# Patient Record
Sex: Female | Born: 1995 | Race: White | Hispanic: No | Marital: Married | Smoking: Never smoker
Health system: Southern US, Community
[De-identification: ages and names within clinical notes are randomized; demographics above are authoritative.]

## PROBLEM LIST (undated history)

## (undated) DIAGNOSIS — F419 Anxiety disorder, unspecified: Secondary | ICD-10-CM

## (undated) DIAGNOSIS — G43909 Migraine, unspecified, not intractable, without status migrainosus: Secondary | ICD-10-CM

## (undated) DIAGNOSIS — T7840XA Allergy, unspecified, initial encounter: Secondary | ICD-10-CM

## (undated) DIAGNOSIS — F32A Depression, unspecified: Secondary | ICD-10-CM

## (undated) DIAGNOSIS — D6851 Activated protein C resistance: Secondary | ICD-10-CM

## (undated) HISTORY — DX: Anxiety disorder, unspecified: F41.9

## (undated) HISTORY — DX: Activated protein C resistance: D68.51

## (undated) HISTORY — DX: Migraine, unspecified, not intractable, without status migrainosus: G43.909

## (undated) HISTORY — DX: Allergy, unspecified, initial encounter: T78.40XA

## (undated) HISTORY — DX: Depression, unspecified: F32.A

---
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
## 2017-01-05 DIAGNOSIS — Z227 Latent tuberculosis: Secondary | ICD-10-CM | POA: Insufficient documentation

## 2017-01-05 DIAGNOSIS — D6851 Activated protein C resistance: Secondary | ICD-10-CM | POA: Insufficient documentation

## 2017-01-05 DIAGNOSIS — J45909 Unspecified asthma, uncomplicated: Secondary | ICD-10-CM | POA: Insufficient documentation

## 2019-05-24 DIAGNOSIS — F32A Depression, unspecified: Secondary | ICD-10-CM | POA: Insufficient documentation

## 2019-06-01 ENCOUNTER — Ambulatory Visit
Admission: RE | Admit: 2019-06-01 | Discharge: 2019-06-01 | Disposition: A | Discharge status: Home / Self Care | Payer: BC Managed Care – PPO | Source: Ambulatory Visit | Attending: Family Medicine | Admitting: Family Medicine

## 2019-06-01 ENCOUNTER — Ambulatory Visit
Admission: RE | Admit: 2019-06-01 | Discharge: 2019-06-01 | Disposition: A | Discharge status: Home / Self Care | Payer: BC Managed Care – PPO | Attending: Family Medicine | Admitting: Family Medicine

## 2019-06-01 ENCOUNTER — Other Ambulatory Visit: Payer: Self-pay

## 2019-06-01 ENCOUNTER — Other Ambulatory Visit: Payer: Self-pay | Admitting: Family Medicine

## 2019-06-01 DIAGNOSIS — Z111 Encounter for screening for respiratory tuberculosis: Secondary | ICD-10-CM | POA: Insufficient documentation

## 2019-10-01 DIAGNOSIS — F419 Anxiety disorder, unspecified: Secondary | ICD-10-CM | POA: Insufficient documentation

## 2019-10-06 ENCOUNTER — Ambulatory Visit: Payer: BC Managed Care – PPO

## 2019-10-12 ENCOUNTER — Ambulatory Visit: Payer: BC Managed Care – PPO

## 2019-10-14 ENCOUNTER — Ambulatory Visit: Payer: BC Managed Care – PPO | Attending: Internal Medicine

## 2019-10-14 DIAGNOSIS — Z23 Encounter for immunization: Secondary | ICD-10-CM

## 2019-10-14 NOTE — Progress Notes (Signed)
   Covid-19 Vaccination Clinic  Name:  Kaneshia Cater    MRN: [id number] DOB: 1995-10-17  10/14/2019  Ms. Rodriges was observed post Covid-19 immunization for 15 minutes without incidence. She was provided with Vaccine Information Sheet and instruction to access the V-Safe system.   Ms. Halbleib was instructed to call 911 with any severe reactions post vaccine: Marland Kitchen Difficulty breathing  . Swelling of your face and throat  . A fast heartbeat  . A bad rash all over your body  . Dizziness and weakness    Immunizations Administered    Name Date Dose VIS Date Route   Pfizer COVID-19 Vaccine 10/14/2019 11:51 AM 0.3 mL 08/03/2019 Intramuscular   Manufacturer: [employer], [employer]   Lot: [id number]   NDC: [address]-1

## 2019-11-06 ENCOUNTER — Ambulatory Visit: Payer: BC Managed Care – PPO | Attending: Internal Medicine

## 2019-11-06 DIAGNOSIS — Z23 Encounter for immunization: Secondary | ICD-10-CM

## 2019-11-06 NOTE — Progress Notes (Signed)
   Covid-19 Vaccination Clinic  Name:  Valerie Bates    MRN: [id number] DOB: 28-Aug-1995  11/06/2019  Valerie Bates was observed post Covid-19 immunization for 15 minutes without incident. She was provided with Vaccine Information Sheet and instruction to access the V-Safe system.   Valerie Bates was instructed to call 911 with any severe reactions post vaccine: Marland Kitchen Difficulty breathing  . Swelling of face and throat  . A fast heartbeat  . A bad rash all over body  . Dizziness and weakness   Immunizations Administered    Name Date Dose VIS Date Route   Pfizer COVID-19 Vaccine 11/06/2019  9:07 AM 0.3 mL 08/03/2019 Intramuscular   Manufacturer: [employer], [employer]   Lot: [id number]   NDC: [address]-5

## 2020-05-14 ENCOUNTER — Other Ambulatory Visit: Payer: Self-pay | Admitting: Family Medicine

## 2020-05-14 ENCOUNTER — Ambulatory Visit
Admission: RE | Admit: 2020-05-14 | Discharge: 2020-05-14 | Disposition: A | Discharge status: Home / Self Care | Payer: BLUE CROSS/BLUE SHIELD | Attending: Family Medicine | Admitting: Family Medicine

## 2020-05-14 ENCOUNTER — Other Ambulatory Visit: Payer: Self-pay

## 2020-05-14 ENCOUNTER — Ambulatory Visit
Admission: RE | Admit: 2020-05-14 | Discharge: 2020-05-14 | Disposition: A | Discharge status: Home / Self Care | Payer: BLUE CROSS/BLUE SHIELD | Source: Ambulatory Visit | Attending: Family Medicine | Admitting: Family Medicine

## 2020-05-14 DIAGNOSIS — Z111 Encounter for screening for respiratory tuberculosis: Secondary | ICD-10-CM

## 2020-05-26 ENCOUNTER — Ambulatory Visit: Payer: BLUE CROSS/BLUE SHIELD | Attending: Internal Medicine

## 2020-05-26 DIAGNOSIS — Z23 Encounter for immunization: Secondary | ICD-10-CM

## 2020-05-26 NOTE — Progress Notes (Signed)
   Covid-19 Vaccination Clinic  Name:  Valerie Bates    MRN: [id number] DOB: [birth year]  05/26/2020  Valerie Bates was observed post Covid-19 immunization for 15 minutes without incident. She was provided with Vaccine Information Sheet and instruction to access the V-Safe system.   Valerie Bates was instructed to call 911 with any severe reactions post vaccine: Marland Kitchen Difficulty breathing  . Swelling of face and throat  . A fast heartbeat  . A bad rash all over body  . Dizziness and weakness

## 2021-11-25 IMAGING — CR DG CHEST 2V
2 series · 2 of 2 positions shown · non-contrast
Comparison: 06/01/2019

CLINICAL DATA: History of treated TB.  No current complaints.

EXAM:
CHEST - 2 VIEW

[chest pa]
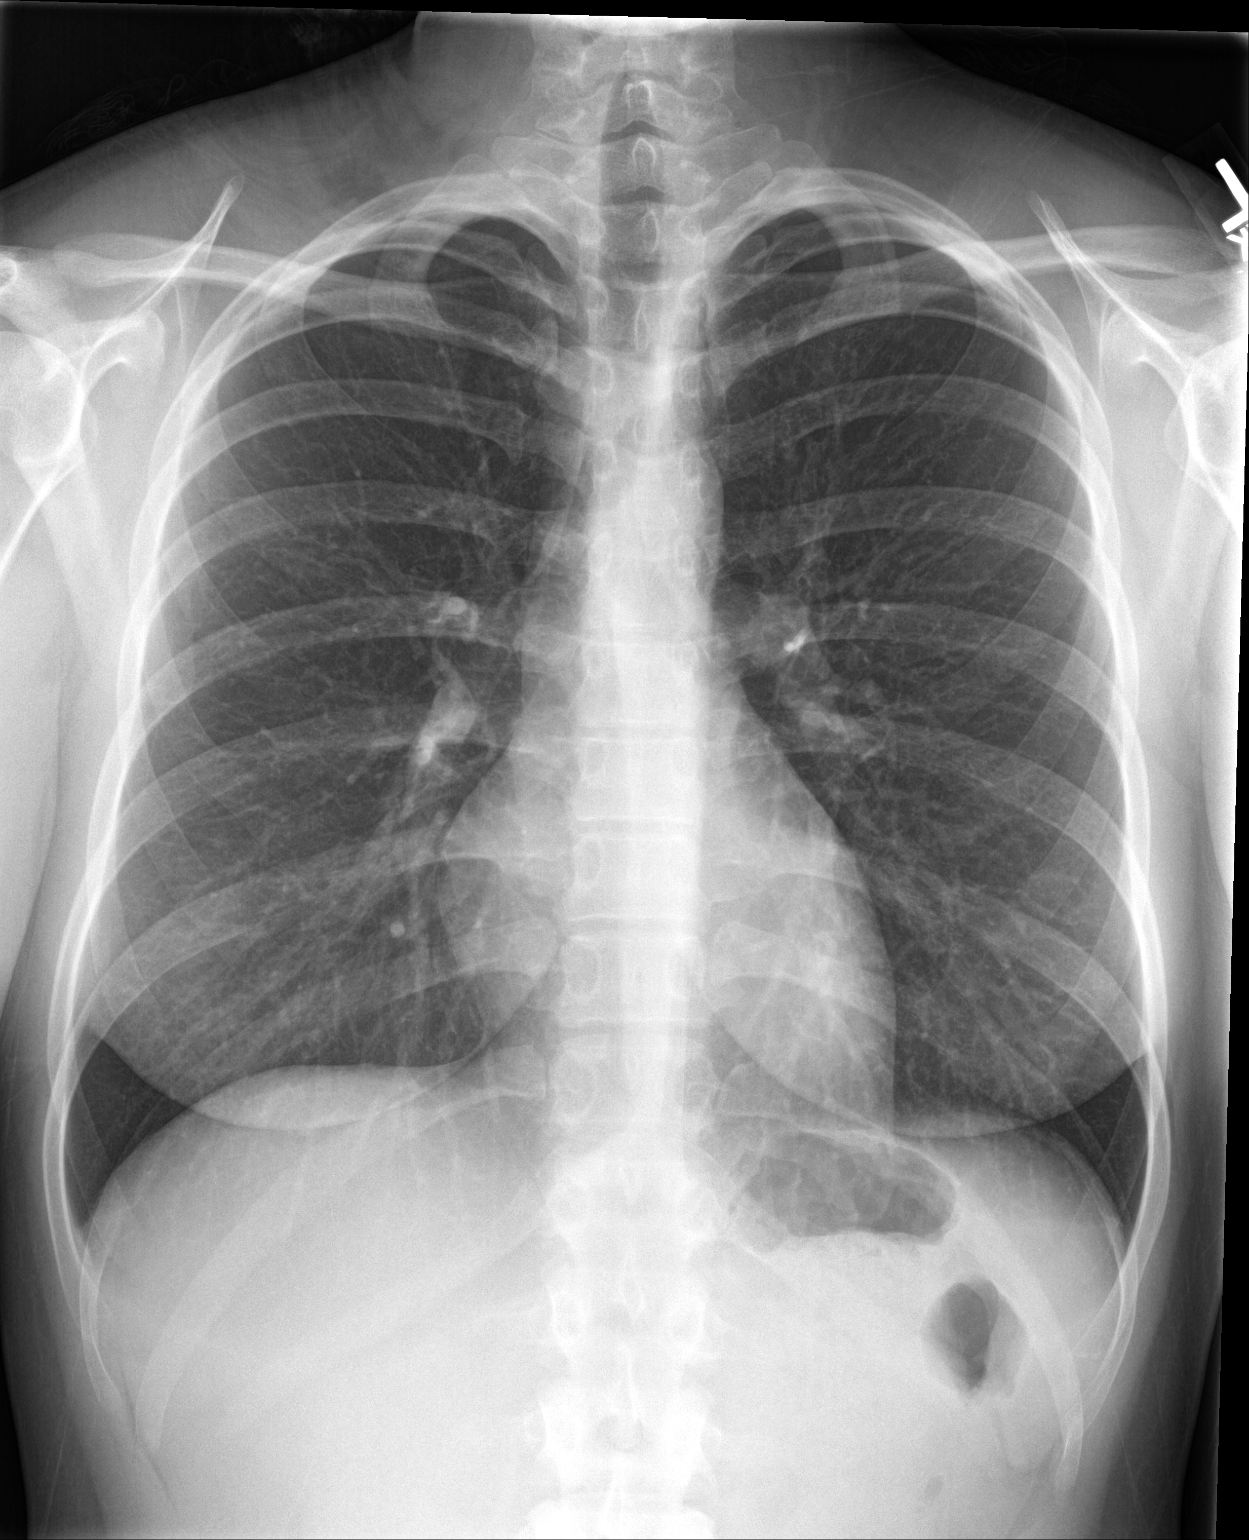

[chest lat]
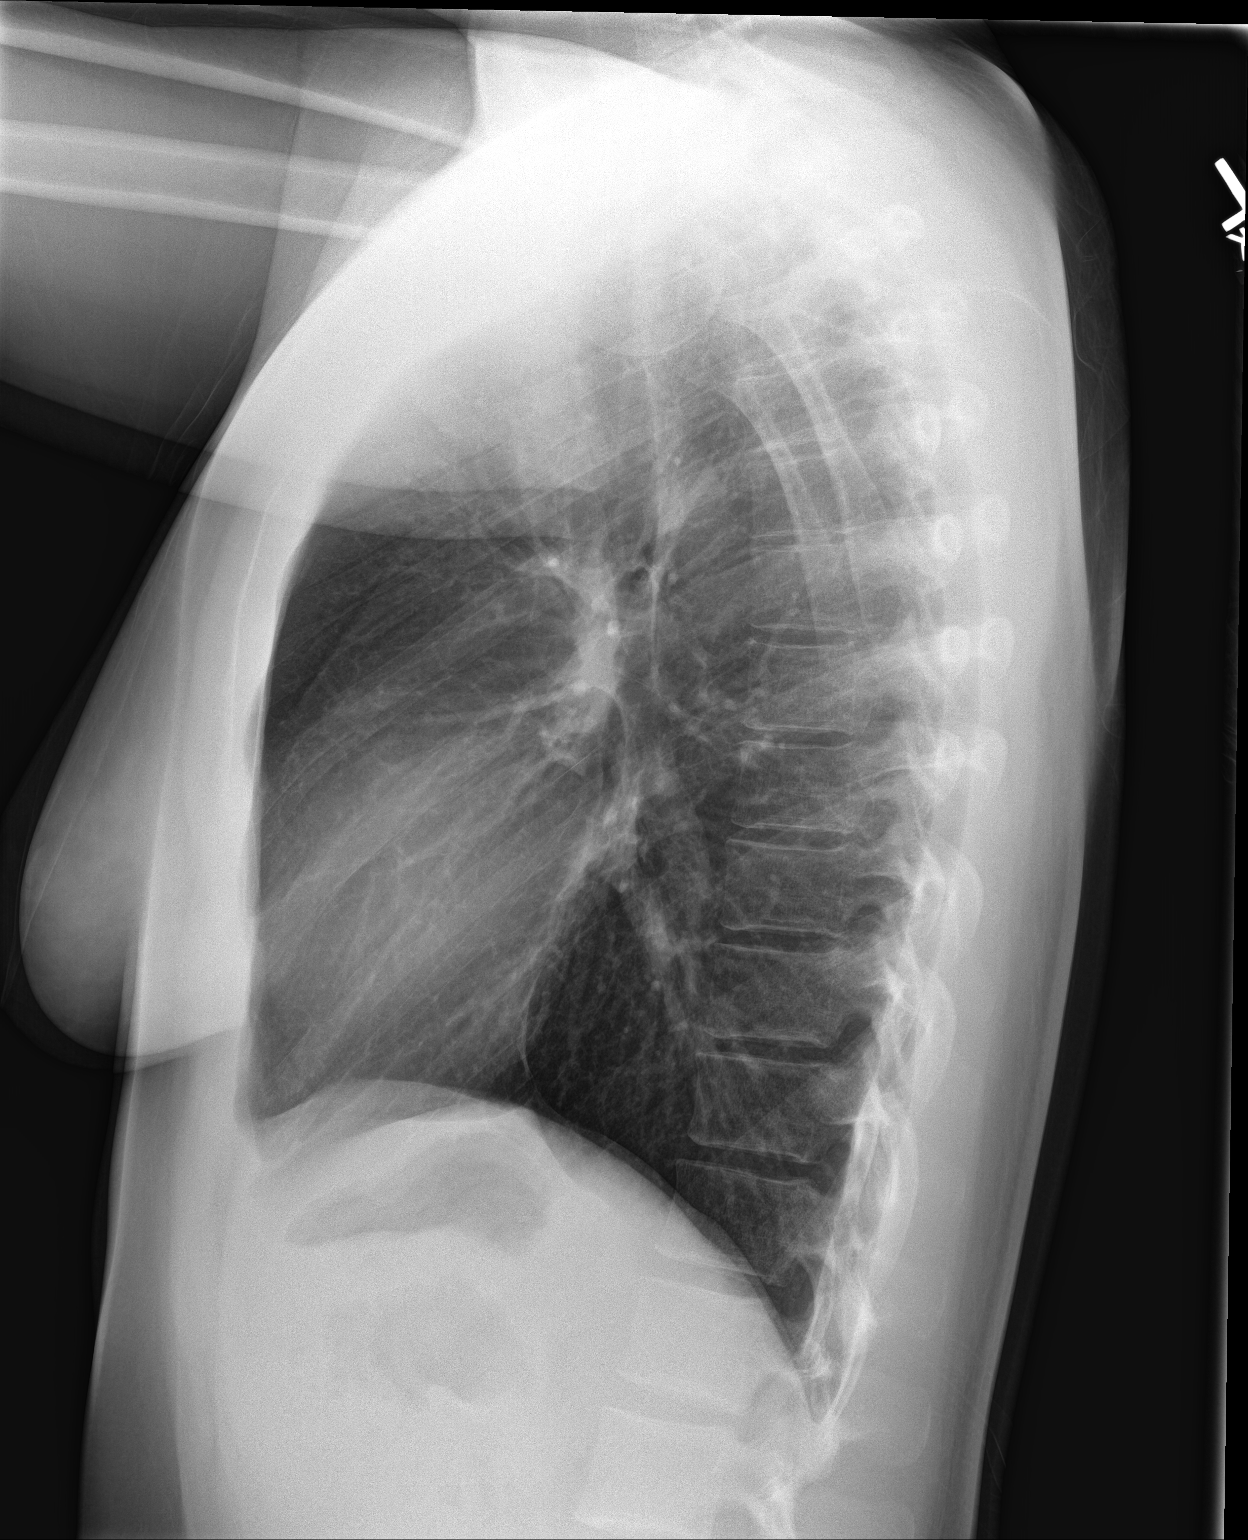

[2 of 2 positions shown; findings below may reference images not displayed]

FINDINGS: The heart size and mediastinal contours are within normal limits.
Both lungs are clear. No pleural effusions. No pneumothorax. The
visualized skeletal structures are unremarkable.
IMPRESSION: No evidence of acute cardiopulmonary disease. Similar appearance of
the chest compared to 06/01/2019.

## 2022-06-03 ENCOUNTER — Ambulatory Visit (INDEPENDENT_AMBULATORY_CARE_PROVIDER_SITE_OTHER): Payer: BC Managed Care – PPO | Admitting: Certified Nurse Midwife

## 2022-06-03 ENCOUNTER — Other Ambulatory Visit (HOSPITAL_COMMUNITY)
Admission: RE | Admit: 2022-06-03 | Discharge: 2022-06-03 | Disposition: A | Discharge status: Home / Self Care | Payer: BC Managed Care – PPO | Source: Ambulatory Visit | Attending: Certified Nurse Midwife | Admitting: Certified Nurse Midwife

## 2022-06-03 ENCOUNTER — Encounter: Payer: Self-pay | Admitting: Certified Nurse Midwife

## 2022-06-03 VITALS — BP 115/75 | HR 64 | Resp 16 | Ht 67.0 in | Wt 181.6 lb

## 2022-06-03 DIAGNOSIS — Z01419 Encounter for gynecological examination (general) (routine) without abnormal findings: Secondary | ICD-10-CM

## 2022-06-03 DIAGNOSIS — Z124 Encounter for screening for malignant neoplasm of cervix: Secondary | ICD-10-CM | POA: Diagnosis present

## 2022-06-03 MED ORDER — SERTRALINE HCL 50 MG PO TABS: 50.0000 mg | ORAL_TABLET | Freq: Every day | ORAL | 11 refills | Status: DC

## 2022-06-03 NOTE — Progress Notes (Addendum)
GYNECOLOGY ANNUAL PREVENTATIVE CARE ENCOUNTER NOTE  History:     Valerie Bates is a 26 y.o. G0P0000 female here for a routine annual gynecologic exam.  Current complaints: none.   Denies abnormal vaginal bleeding, discharge, pelvic pain, problems with intercourse or other gynecologic concerns.     Social Relationship: married  Living: spouse  Work: Engineering geologist. at [employer]  Exercise: 4-5 times a week  Smoke/Alcohol/drug use: occasional alcohol use, denies smoking/drug use   Gynecologic History Patient's last menstrual period was 05/18/2022 (exact date). Contraception: IUD, Lileta - Dec 2020 Last Pap: has not had. Last mammogram:  not indicated  Obstetric History OB History  Gravida Para Term Preterm AB Living  0 0 0 0 0 0  SAB IAB Ectopic Multiple Live Births  0 0 0 0 0    Past Medical History:  Diagnosis Date   Anxiety    Depression    Factor 5 Leiden mutation, heterozygous ([place])     History reviewed. No pertinent surgical history.  Current Outpatient Medications on File Prior to Visit  Medication Sig Dispense Refill   cetirizine (ZYRTEC) 10 MG tablet Take 10 mg by mouth daily.     levonorgestrel (LILETTA, 52 MG,) 20.1 MCG/DAY IUD IUD by Intrauterine route.     montelukast (SINGULAIR) 10 MG tablet Take 10 mg by mouth daily.     No current facility-administered medications on file prior to visit.    Allergies  Allergen Reactions   Other Itching    Other reaction(s): Runny Nose    Social History:  reports that she has never smoked. She has never used smokeless tobacco. She reports current alcohol use. She reports that she does not use drugs.  Family History  Problem Relation Age of Onset   Depression Sister    Anxiety disorder Sister    Ovarian cancer Maternal Grandmother    Cancer Maternal Grandfather    Breast cancer Paternal Grandmother    Heart disease Paternal Grandmother     The following portions of the patient's history were reviewed and updated as  appropriate: allergies, current medications, past family history, past medical history, past social history, past surgical history and problem list.  Review of Systems Pertinent items noted in HPI and remainder of comprehensive ROS otherwise negative.  Physical Exam:  BP 115/75   Pulse 64   Resp 16   Ht 5\' 7"  (1.702 m)   Wt 181 lb 9.6 oz (82.4 kg)   LMP 05/18/2022 (Exact Date)   BMI 28.44 kg/m  CONSTITUTIONAL: Well-developed, well-nourished female in no acute distress.  HENT:  Normocephalic, atraumatic, External right and left ear normal. Oropharynx is clear and moist EYES: Conjunctivae and EOM are normal. Pupils are equal, round, and reactive to light. No scleral icterus.  NECK: Normal range of motion, supple, no masses.  Normal thyroid.  SKIN: Skin is warm and dry. No rash noted. Not diaphoretic. No erythema. No pallor. MUSCULOSKELETAL: Normal range of motion. No tenderness.  No cyanosis, clubbing, or edema.  2+ distal pulses. NEUROLOGIC: Alert and oriented to person, place, and time. Normal reflexes, muscle tone coordination.  PSYCHIATRIC: Normal mood and affect. Normal behavior. Normal judgment and thought content. CARDIOVASCULAR: Normal heart rate noted, regular rhythm RESPIRATORY: Clear to auscultation bilaterally. Effort and breath sounds normal, no problems with respiration noted. BREASTS: Symmetric in size. No masses, tenderness, skin changes, nipple drainage, or lymphadenopathy bilaterally.  ABDOMEN: Soft, no distention noted.  No tenderness, rebound or guarding.  PELVIC: Normal appearing external genitalia and  urethral meatus; normal appearing vaginal mucosa and cervix.  No abnormal discharge noted. IUD strings present. Pap smear obtained.  Contact bleeding, Normal uterine size, no other palpable masses, no uterine or adnexal tenderness.  .   Assessment and Plan:    1. Women's annual routine gynecological examination    Pap: Will follow up results of pap smear and manage  accordingly Mammogram : n/a  Labs: none Refills: Zoloft  Referral: none  Routine preventative health maintenance measures emphasized. Please refer to After Visit Summary for other counseling recommendations.      Doreene Burke, CNM [facility],  [facility]

## 2022-06-03 NOTE — Addendum Note (Signed)
Addended by: Hildred Priest on: 06/03/2022 01:41 PM   Modules accepted: Orders

## 2022-06-03 NOTE — Patient Instructions (Signed)

## 2022-06-10 LAB — CYTOLOGY - PAP: Diagnosis: NEGATIVE

## 2022-06-21 ENCOUNTER — Encounter: Payer: Self-pay | Admitting: Internal Medicine

## 2022-06-21 ENCOUNTER — Ambulatory Visit (INDEPENDENT_AMBULATORY_CARE_PROVIDER_SITE_OTHER): Payer: BC Managed Care – PPO | Admitting: Internal Medicine

## 2022-06-21 VITALS — BP 110/68 | HR 60 | Temp 98.1°F | Ht 67.0 in | Wt 181.2 lb

## 2022-06-21 DIAGNOSIS — Z114 Encounter for screening for human immunodeficiency virus [HIV]: Secondary | ICD-10-CM | POA: Diagnosis not present

## 2022-06-21 DIAGNOSIS — Z1159 Encounter for screening for other viral diseases: Secondary | ICD-10-CM

## 2022-06-21 DIAGNOSIS — F419 Anxiety disorder, unspecified: Secondary | ICD-10-CM

## 2022-06-21 DIAGNOSIS — R7301 Impaired fasting glucose: Secondary | ICD-10-CM | POA: Diagnosis not present

## 2022-06-21 DIAGNOSIS — R5383 Other fatigue: Secondary | ICD-10-CM

## 2022-06-21 DIAGNOSIS — J309 Allergic rhinitis, unspecified: Secondary | ICD-10-CM

## 2022-06-21 DIAGNOSIS — E785 Hyperlipidemia, unspecified: Secondary | ICD-10-CM | POA: Diagnosis not present

## 2022-06-21 DIAGNOSIS — Z975 Presence of (intrauterine) contraceptive device: Secondary | ICD-10-CM | POA: Insufficient documentation

## 2022-06-21 DIAGNOSIS — D6851 Activated protein C resistance: Secondary | ICD-10-CM

## 2022-06-21 DIAGNOSIS — J3089 Other allergic rhinitis: Secondary | ICD-10-CM

## 2022-06-21 DIAGNOSIS — J4520 Mild intermittent asthma, uncomplicated: Secondary | ICD-10-CM

## 2022-06-21 HISTORY — DX: Allergic rhinitis, unspecified: J30.9

## 2022-06-21 MED ORDER — MONTELUKAST SODIUM 10 MG PO TABS: 10.0000 mg | ORAL_TABLET | Freq: Every day | ORAL | 3 refills | Status: DC

## 2022-06-21 MED ORDER — SERTRALINE HCL 50 MG PO TABS: 50.0000 mg | ORAL_TABLET | Freq: Every day | ORAL | 3 refills | Status: DC

## 2022-06-21 NOTE — Assessment & Plan Note (Signed)
Found during screening after a family member was diagnosed with PE>  She is heterozygous and is avoiding OCPS.

## 2022-06-21 NOTE — Assessment & Plan Note (Signed)
Managed with Zurtec, singulair, flonase ( prn) and allergy desensitization done at [facility].  singulair refilled

## 2022-06-21 NOTE — Progress Notes (Signed)
Subjective:  Patient ID: Valerie Bates, female    DOB: April 02, 1996  Age: 26 y.o. MRN: [id number]  CC: The primary encounter diagnosis was Hyperlipidemia, unspecified hyperlipidemia type. Diagnoses of Impaired fasting glucose, Other fatigue, Encounter for screening for HIV, Need for hepatitis C screening test, Factor V Leiden mutation ([place]), Anxiety, IUD (intrauterine device) in place, Non-seasonal allergic rhinitis due to other allergic trigger, and Mild intermittent asthma, unspecified whether complicated were also pertinent to this visit.  From   [place].   Finished PA scholl at [employer] .   Works as a Advertising account executive.    Allergic rhinitis:  taking allergy shots cetirizine,  and  singulair  \occasional use flonase     Depression/anxiety:  managed  with zoloft  50 mg daily.  Sleeping well. Remote history of panic attacks.  No history of abuse.  Anxiety started during PA school.  Tired lexapro and wellbutrin (gave  her a tremor).  Exercising regularly.  No trouble  sleeping   HH of Factor  V Leiden found during screening.  FH .   Has an IUD for birth control. Placed by [facility] 2 years ago.    Still has monthly periods  PAP normal last month [facility] .   Historf of migraines:  occur when stressed ,  for the last 2 years..  infrequent .  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 They lilve in [place].    HPI Brayley Mackowiak presents for establishment of care   History Amilah has a past medical history of Allergy, Anxiety, Depression, and Factor 5 Leiden mutation, heterozygous ([place]).   She has a past surgical history that includes Wisdom tooth extraction (2015).   Her family history includes Anxiety disorder in her sister; Arthritis in her mother; Breast cancer in her maternal aunt, paternal aunt, and paternal grandmother; COPD in her maternal grandmother; Cancer in her  maternal grandfather, maternal grandmother, and paternal grandmother; Depression in her maternal grandmother and sister; Hearing loss in her mother; Heart disease in her paternal grandfather and paternal grandmother; Hyperlipidemia in her father; Migraines in her brother and paternal grandfather; Ovarian cancer in her maternal grandmother.She reports that she has never smoked. She has never used smokeless tobacco. She reports current alcohol use. She reports that she does not use drugs.  Outpatient Medications Prior to Visit  Medication Sig Dispense Refill   cetirizine (ZYRTEC) 10 MG tablet Take 10 mg by mouth daily.     levonorgestrel (LILETTA, 52 MG,) 20.1 MCG/DAY IUD IUD by Intrauterine route.     montelukast (SINGULAIR) 10 MG tablet Take 10 mg by mouth daily.     sertraline (ZOLOFT) 50 MG tablet Take 1 tablet (50 mg total) by mouth daily. 30 tablet 11   No facility-administered medications prior to visit.    Review of Systems:  Patient denies headache, fevers, malaise, unintentional weight loss, skin rash, eye pain, sinus congestion and sinus pain, sore throat, dysphagia,  hemoptysis , cough, dyspnea, wheezing, chest pain, palpitations, orthopnea, edema, abdominal pain, nausea, melena, diarrhea, constipation, flank pain, dysuria, hematuria, urinary  Frequency, nocturia, numbness, tingling, seizures,  Focal weakness, Loss of consciousness,  Tremor, insomnia, depression, anxiety, and suicidal ideation.     Objective:  BP 110/68 (BP Location: Left Arm, Patient Position: Sitting, Cuff Size: Large)   Pulse  60   Temp 98.1 F (36.7 C) (Oral)   Ht 5\' 7"  (1.702 m)   Wt 181 lb 3.2 oz (82.2 kg)   LMP 05/18/2022 (Exact Date)   SpO2 97%   BMI 28.38 kg/m   Physical Exam:   General appearance: alert, cooperative and appears stated age Ears: normal TM's and external ear canals both ears Throat: lips, mucosa, and tongue normal; teeth and gums normal Neck: no adenopathy, no carotid bruit,  supple, symmetrical, trachea midline and thyroid not enlarged, symmetric, no tenderness/mass/nodules Back: symmetric, no curvature. ROM normal. No CVA tenderness. Lungs: clear to auscultation bilaterally Heart: regular rate and rhythm, S1, S2 normal, no murmur, click, rub or gallop Abdomen: soft, non-tender; bowel sounds normal; no masses,  no organomegaly Pulses: 2+ and symmetric Skin: Skin color, texture, turgor normal. No rashes or lesions Lymph nodes: Cervical, supraclavicular, and axillary nodes normal.   Assessment & Plan:   Problem List Items Addressed This Visit     Anxiety    Prior trials of lexapro which was ineffective and wellbutrin which caused a tremor.  Symptoms are currently managed with zoloft and exercise. She reports no recent history of  panic attacks      Relevant Medications   sertraline (ZOLOFT) 50 MG tablet   Asthma    No history of hospitalizations,  No daily symptoms . Date of diagnosis and mode of diagnosis unclear      Relevant Medications   montelukast (SINGULAIR) 10 MG tablet   Factor V Leiden mutation (HCC)    Found during screening after a family member was diagnosed with PE>  She is heterozygous and is avoiding OCPS.        IUD (intrauterine device) in place   Allergic rhinitis    Managed with Zurtec, singulair, flonase ( prn) and allergy desensitization done at [facility].  singulair refilled       Other Visit Diagnoses     Hyperlipidemia, unspecified hyperlipidemia type    -  Primary   Relevant Orders   Lipid panel   LDL cholesterol, direct   Impaired fasting glucose       Relevant Orders   Comprehensive metabolic panel   Hemoglobin A1c   Other fatigue       Relevant Orders   CBC with Differential/Platelet   TSH   Encounter for screening for HIV       Relevant Orders   HIV antibody (with reflex)   Need for hepatitis C screening test       Relevant Orders   Hepatitis C Antibody       I have changed [facility]  "Liz"'s montelukast. I am also having her maintain her Liletta (52 MG), cetirizine, and sertraline.  Meds ordered this encounter  Medications   sertraline (ZOLOFT) 50 MG tablet    Sig: Take 1 tablet (50 mg total) by mouth daily.    Dispense:  90 tablet    Refill:  3   montelukast (SINGULAIR) 10 MG tablet    Sig: Take 1 tablet (10 mg total) by mouth daily.    Dispense:  90 tablet    Refill:  3    Medications Discontinued During This Encounter  Medication Reason   sertraline (ZOLOFT) 50 MG tablet Reorder   montelukast (SINGULAIR) 10 MG tablet Reorder    Follow-up: No follow-ups on file.   Gigi Gin, MD

## 2022-06-21 NOTE — Assessment & Plan Note (Addendum)
Prior trials of lexapro which was ineffective and wellbutrin which caused a tremor.  Symptoms are currently managed with zoloft and exercise. She reports no recent history of  panic attacks

## 2022-06-21 NOTE — Patient Instructions (Signed)
Welcome!  Cell phone [phone]  call If you  need anything   Check on family history of age at diagnosis of BRCA

## 2022-06-21 NOTE — Assessment & Plan Note (Signed)
No history of hospitalizations,  No daily symptoms . Date of diagnosis and mode of diagnosis unclear

## 2022-06-22 LAB — COMPREHENSIVE METABOLIC PANEL
ALT: 11 U/L (ref 0–35)
AST: 13 U/L (ref 0–37)
Albumin: 4.4 g/dL (ref 3.5–5.2)
Alkaline Phosphatase: 46 U/L (ref 39–117)
BUN: 18 mg/dL (ref 6–23)
CO2: 25 mEq/L (ref 19–32)
Calcium: 9.3 mg/dL (ref 8.4–10.5)
Chloride: 105 mEq/L (ref 96–112)
Creatinine, Ser: 0.7 mg/dL (ref 0.40–1.20)
GFR: 119.26 mL/min (ref >60.00)
Glucose, Bld: 78 mg/dL (ref 70–99)
Potassium: 4.1 mEq/L (ref 3.5–5.1)
Sodium: 138 mEq/L (ref 135–145)
Total Bilirubin: 0.4 mg/dL (ref 0.2–1.2)
Total Protein: 6.8 g/dL (ref 6.0–8.3)

## 2022-06-22 LAB — HEPATITIS C ANTIBODY: Hepatitis C Ab: NONREACTIVE

## 2022-06-22 LAB — CBC WITH DIFFERENTIAL/PLATELET
Basophils Absolute: 0 10*3/uL (ref 0.0–0.1)
Basophils Relative: 0.5 % (ref 0.0–3.0)
Eosinophils Absolute: 0.2 10*3/uL (ref 0.0–0.7)
Eosinophils Relative: 3.3 % (ref 0.0–5.0)
HCT: 39.9 % (ref 36.0–46.0)
Hemoglobin: 13.6 g/dL (ref 12.0–15.0)
Lymphocytes Relative: 29.9 % (ref 12.0–46.0)
Lymphs Abs: 2.2 10*3/uL (ref 0.7–4.0)
MCHC: 34.1 g/dL (ref 30.0–36.0)
MCV: 89.4 fl (ref 78.0–100.0)
Monocytes Absolute: 0.5 10*3/uL (ref 0.1–1.0)
Monocytes Relative: 6.4 % (ref 3.0–12.0)
Neutro Abs: 4.5 10*3/uL (ref 1.4–7.7)
Neutrophils Relative %: 59.9 % (ref 43.0–77.0)
Platelets: 245 10*3/uL (ref 150.0–400.0)
RBC: 4.46 Mil/uL (ref 3.87–5.11)
RDW: 13 % (ref 11.5–15.5)
WBC: 7.5 10*3/uL (ref 4.0–10.5)

## 2022-06-22 LAB — LIPID PANEL
Cholesterol: 151 mg/dL (ref 0–200)
HDL: 60.4 mg/dL (ref >39.00)
LDL Cholesterol: 78 mg/dL (ref 0–99)
NonHDL: 90.16
Total CHOL/HDL Ratio: 2
Triglycerides: 62 mg/dL (ref 0.0–149.0)
VLDL: 12.4 mg/dL (ref 0.0–40.0)

## 2022-06-22 LAB — HEMOGLOBIN A1C: Hgb A1c MFr Bld: 5.5 % (ref 4.6–6.5)

## 2022-06-22 LAB — TSH: TSH: 2.1 u[IU]/mL (ref 0.35–5.50)

## 2022-06-22 LAB — LDL CHOLESTEROL, DIRECT: Direct LDL: 76 mg/dL

## 2022-06-22 LAB — HIV ANTIBODY (ROUTINE TESTING W REFLEX): HIV 1&2 Ab, 4th Generation: NONREACTIVE

## 2022-07-27 ENCOUNTER — Ambulatory Visit: Payer: BLUE CROSS/BLUE SHIELD | Admitting: Family Medicine

## 2022-10-18 ENCOUNTER — Other Ambulatory Visit: Payer: Self-pay | Admitting: Student

## 2022-10-18 DIAGNOSIS — H9041 Sensorineural hearing loss, unilateral, right ear, with unrestricted hearing on the contralateral side: Secondary | ICD-10-CM

## 2022-12-20 ENCOUNTER — Telehealth: Payer: Self-pay

## 2022-12-20 ENCOUNTER — Encounter: Payer: Self-pay | Admitting: Internal Medicine

## 2022-12-20 MED ORDER — SERTRALINE HCL 100 MG PO TABS: 100.0000 mg | ORAL_TABLET | Freq: Every day | ORAL | 1 refills | Status: DC

## 2022-12-20 NOTE — Telephone Encounter (Signed)
Patient states she would like to know if she can increase the dosage for her sertraline (ZOLOFT) 50 MG tablet from 50 MG to 100 MG.  Patient states she does not feel like the 50 MG has been helping, so she started taking 100 MG and it has been working much better for her.  Patient states she would like to know if Dr. Duncan Dull would please send in a new prescription for this medication at 100 MG.   Patient states her preferred pharmacy is still [facility] in [place].

## 2022-12-21 NOTE — Telephone Encounter (Signed)
Pt is aware and gave a verbal understanding.  

## 2023-02-10 ENCOUNTER — Other Ambulatory Visit: Payer: Self-pay

## 2023-02-10 MED ORDER — CEFPODOXIME PROXETIL 200 MG PO TABS
200.0000 mg | ORAL_TABLET | Freq: Every day | ORAL | 2 refills | Status: DC
  Filled 2023-02-10: qty 30, 30d supply, fill #0

## 2023-02-10 MED ORDER — PREDNISONE 10 MG PO TABS
10.0000 mg | ORAL_TABLET | ORAL | 1 refills | Status: DC
  Filled 2023-02-10: qty 90, 90d supply, fill #0

## 2023-02-17 ENCOUNTER — Encounter: Payer: Self-pay | Admitting: Obstetrics

## 2023-02-17 ENCOUNTER — Ambulatory Visit (INDEPENDENT_AMBULATORY_CARE_PROVIDER_SITE_OTHER): Payer: BC Managed Care – PPO | Admitting: Obstetrics

## 2023-02-17 VITALS — BP 117/68 | HR 59 | Ht 67.0 in | Wt 182.0 lb

## 2023-02-17 DIAGNOSIS — Z30432 Encounter for removal of intrauterine contraceptive device: Secondary | ICD-10-CM

## 2023-02-17 NOTE — Progress Notes (Signed)
Valerie Bates presents to the office for an IUD removal. She is a new pateint for AOB. Her Rutha Bouchard was placed by [facility] in 2020.  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 She would like to get pregnant.    Of note, she has Factor V Leiden mutation. She also takes daily Zoloft at 100 mg po for anxiety. Recently had the dosage increased from 50 mg to 100mg   BP 117/68   Pulse (!) 59   Ht 5\' 7"  (1.702 m)   Wt 182 lb (82.6 kg)   LMP 02/02/2023 (Exact Date)   BMI 28.51 kg/m  Review of Systems  Constitutional: Negative.   Eyes: Negative.   Respiratory: Negative.    Cardiovascular: Negative.   Gastrointestinal: Negative.   Genitourinary: Negative.   Musculoskeletal: Negative.   Skin: Negative.   Psychiatric/Behavioral:  The patient is nervous/anxious.        Hx of anxiety- take Zoloft daily   Physical Exam Constitutional:      Appearance: Normal appearance.  Cardiovascular:     Rate and Rhythm: Normal rate and regular rhythm.     Pulses: Normal pulses.     Heart sounds: Normal heart sounds.  Pulmonary:     Effort: Pulmonary effort is normal.     Breath sounds: Normal breath sounds.  Abdominal:     Palpations: Abdomen is soft.  Genitourinary:    General: Normal vulva.     Rectum: Normal.     Comments: No external lesions or rashes. Normal appearing vaginal mucosa, scant vaginal discharge. Initially the IUD strings are not visible- (See removal note)  Bimanual_- uterus is non enlarged, anteverted. Neurological:     General: No focal deficit present.     Mental Status: She is alert and oriented to person, place, and time.  Psychiatric:        Mood and Affect: Mood normal.        Behavior: Behavior normal.   A: 27 yo new patient for IUD removal Desires conception    IUD Removal  Patient identified, informed consent performed, consent signed.  Patient was in the dorsal lithotomy position, normal external genitalia was noted.  A speculum was placed in the patient's vagina, normal discharge  was noted, no lesions. The cervix was visualized, no lesions, no abnormal discharge.   (The strings of the IUD were not visualized, so Kelly forceps were introduced into the endometrial cavity and the IUD was grasped and removed in its entirety).  Patient tolerated the procedure well.    Patient will use nothing for contraception/, and plans for pregnancy soon and she was told to avoid teratogens, take PNV and folic acid.  Routine preventative health maintenance measures emphasized.    RTC PRN  Mirna Mires, CNM  02/17/2023 5:08 PM

## 2023-04-27 ENCOUNTER — Other Ambulatory Visit: Payer: Self-pay

## 2023-04-27 MED ORDER — CEPHALEXIN 500 MG PO CAPS
500.0000 mg | ORAL_CAPSULE | Freq: Two times a day (BID) | ORAL | 0 refills | Status: DC
  Filled 2023-04-27: qty 28, 14d supply, fill #0

## 2023-05-25 ENCOUNTER — Ambulatory Visit: Payer: BC Managed Care – PPO | Admitting: *Deleted

## 2023-05-25 ENCOUNTER — Encounter: Payer: Self-pay | Admitting: *Deleted

## 2023-05-25 VITALS — BP 118/67 | HR 65 | Ht 67.0 in | Wt 172.1 lb

## 2023-05-25 DIAGNOSIS — O3680X0 Pregnancy with inconclusive fetal viability, not applicable or unspecified: Secondary | ICD-10-CM

## 2023-05-25 DIAGNOSIS — Z3201 Encounter for pregnancy test, result positive: Secondary | ICD-10-CM | POA: Diagnosis not present

## 2023-05-25 DIAGNOSIS — O219 Vomiting of pregnancy, unspecified: Secondary | ICD-10-CM

## 2023-05-25 DIAGNOSIS — Z3200 Encounter for pregnancy test, result unknown: Secondary | ICD-10-CM

## 2023-05-25 LAB — POCT PREGNANCY, URINE: Preg Test, Ur: POSITIVE — AB

## 2023-05-25 NOTE — Progress Notes (Cosign Needed)
Pt presents for urine pregnancy test which is positive.  She reports sure LMP 04/21/23 which yields EDD 01/26/24, now [redacted]w[redacted]d. Pt denies vaginal bleeding or abdominal pain. She was advised to go to [facility] if she develops these symptoms.  [facility] ultrasound scheduled on 10/16 @ 1:15 pm. Edd Arbour in to visit pt. She will prescribe Diclegis for nausea. Pt will schedule prenatal care in this office.

## 2023-05-26 MED ORDER — DOXYLAMINE-PYRIDOXINE 10-10 MG PO TBEC: 1.0000 | DELAYED_RELEASE_TABLET | Freq: Every day | ORAL | 3 refills | Status: DC

## 2023-05-26 NOTE — Addendum Note (Signed)
Addended by: Edd Arbour on: 05/26/2023 07:16 PM   Modules accepted: Orders

## 2023-06-08 ENCOUNTER — Other Ambulatory Visit: Payer: Self-pay | Admitting: Certified Nurse Midwife

## 2023-06-08 ENCOUNTER — Other Ambulatory Visit: Payer: Self-pay

## 2023-06-08 ENCOUNTER — Ambulatory Visit (INDEPENDENT_AMBULATORY_CARE_PROVIDER_SITE_OTHER): Payer: BC Managed Care – PPO

## 2023-06-08 DIAGNOSIS — Z3401 Encounter for supervision of normal first pregnancy, first trimester: Secondary | ICD-10-CM

## 2023-06-08 DIAGNOSIS — Z3200 Encounter for pregnancy test, result unknown: Secondary | ICD-10-CM

## 2023-06-08 DIAGNOSIS — O3680X0 Pregnancy with inconclusive fetal viability, not applicable or unspecified: Secondary | ICD-10-CM

## 2023-06-08 DIAGNOSIS — O219 Vomiting of pregnancy, unspecified: Secondary | ICD-10-CM

## 2023-06-08 DIAGNOSIS — Z3A01 Less than 8 weeks gestation of pregnancy: Secondary | ICD-10-CM | POA: Diagnosis not present

## 2023-06-10 ENCOUNTER — Encounter: Payer: Self-pay | Admitting: Certified Nurse Midwife

## 2023-06-10 ENCOUNTER — Other Ambulatory Visit: Payer: Self-pay

## 2023-06-10 DIAGNOSIS — Z3400 Encounter for supervision of normal first pregnancy, unspecified trimester: Secondary | ICD-10-CM | POA: Insufficient documentation

## 2023-06-10 DIAGNOSIS — O0992 Supervision of high risk pregnancy, unspecified, second trimester: Secondary | ICD-10-CM | POA: Insufficient documentation

## 2023-06-10 DIAGNOSIS — O219 Vomiting of pregnancy, unspecified: Secondary | ICD-10-CM

## 2023-06-10 DIAGNOSIS — Z3490 Encounter for supervision of normal pregnancy, unspecified, unspecified trimester: Secondary | ICD-10-CM | POA: Insufficient documentation

## 2023-06-10 MED ORDER — BUDESONIDE 32 MCG/ACT NA SUSP
2.0000 | Freq: Every day | NASAL | 11 refills | Status: AC
  Filled 2023-06-10: qty 8.43, 30d supply, fill #0

## 2023-06-13 MED ORDER — PROMETHAZINE HCL 25 MG PO TABS
25.0000 mg | ORAL_TABLET | Freq: Four times a day (QID) | ORAL | 1 refills | Status: DC | PRN
Start: 2023-06-13 — End: 2023-11-01

## 2023-06-27 ENCOUNTER — Other Ambulatory Visit: Payer: Self-pay

## 2023-07-05 NOTE — Progress Notes (Unsigned)
New OB Intake  I connected with Valerie Bates  on 07/06/23 at  1:15 PM EST by MyChart Video Visit and verified that I am speaking with the correct person using two identifiers. Nurse is located at [facility] and pt is located at home.  I discussed the limitations, risks, security and privacy concerns of performing an evaluation and management service by telephone and the availability of in person appointments. I also discussed with the patient that there may be a patient responsible charge related to this service. The patient expressed understanding and agreed to proceed.  I explained I am completing New OB Intake today. We discussed EDD of 01/26/2024, by Last Menstrual Period. Pt is G1P0000. I reviewed her allergies, medications and Medical/Surgical/OB history.    Patient Active Problem List   Diagnosis Date Noted   Supervision of low-risk first pregnancy 06/10/2023   Allergic rhinitis 06/21/2022   Anxiety 10/01/2019   Factor V Leiden mutation (HCC) 01/05/2017    Concerns addressed today  Delivery Plans Plans to deliver at [facility]. Discussed the nature of our practice with multiple providers including residents and students. Due to the size of the practice, the delivering provider may not be the same as those providing prenatal care.   Patient is not interested in water birth. Offered upcoming OB visit with CNM to discuss further.  MyChart/Babyscripts MyChart access verified. I explained pt will have some visits in office and some virtually. Babyscripts instructions given and order placed. Patient verifies receipt of registration text/e-mail. Account successfully created and app downloaded.  Blood Pressure Cuff/Weight Scale Patient has private insurance; instructed to purchase blood pressure cuff and bring to first prenatal appt. Explained after first prenatal appt pt will check weekly and document in Babyscripts. Patient does have weight scale.  Anatomy US Explained first scheduled  [place] will be around 19 weeks. Anatomy US scheduled for 09/07/23 at 1315.  Is patient a CenteringPregnancy candidate?  Declined Declined due to  would like to stay with Jamilla CNM only  in traditional care model Not a candidate due to  n/a  If accepted,    Is patient a Mom+Baby Combined Care candidate?  Declined   If accepted, confirm patient does not intend to move from the area for at least 12 months, then notify Mom+Baby staff  Interested in [place]? If yes, send referral and doula dot phrase.   Is patient a candidate for Babyscripts Optimization? Yes  First visit review I reviewed new OB appt with patient. Explained pt will be seen by Edd Arbour CNM 07/13/23 at 1415 at first visit. Discussed Avelina Laine genetic screening with patient. Would like both Panorama and Horizon.. Routine prenatal labs  need to be collected at [facility] visit    Last Pap Diagnosis  Date Value Ref Range Status  06/03/2022      - Negative for intraepithelial lesion or malignancy (NILM)    Meryl Crutch, RN 07/06/23 at 1310

## 2023-07-06 ENCOUNTER — Telehealth: Payer: BC Managed Care – PPO

## 2023-07-06 DIAGNOSIS — O0991 Supervision of high risk pregnancy, unspecified, first trimester: Secondary | ICD-10-CM | POA: Diagnosis not present

## 2023-07-06 DIAGNOSIS — Z3A10 10 weeks gestation of pregnancy: Secondary | ICD-10-CM

## 2023-07-06 DIAGNOSIS — Z3401 Encounter for supervision of normal first pregnancy, first trimester: Secondary | ICD-10-CM

## 2023-07-06 DIAGNOSIS — O0990 Supervision of high risk pregnancy, unspecified, unspecified trimester: Secondary | ICD-10-CM | POA: Insufficient documentation

## 2023-07-12 NOTE — Progress Notes (Unsigned)
History:   Kelby Broadbent is a 27 y.o. G1P0000 at [redacted]w[redacted]d by {Ob dating:14516} being seen today for her first obstetrical visit.  Her obstetrical history is significant for {ob risk factors:10154}. Patient {does/does not:19097} intend to breast feed. Pregnancy history fully reviewed.  Patient reports {sx:14538}.      HISTORY: OB History  Gravida Para Term Preterm AB Living  1 0 0 0 0 0  SAB IAB Ectopic Multiple Live Births  0 0 0 0 0    # Outcome Date GA Lbr Len/2nd Weight Sex Type Anes PTL Lv  1 Current             Last pap smear was done *** and was {Normal/Abnormal Appearance:21344::"normal"}  Past Medical History:  Diagnosis Date   Allergy    Anxiety    Depression    Factor 5 Leiden mutation, heterozygous (HCC)    Migraines    Past Surgical History:  Procedure Laterality Date   WISDOM TOOTH EXTRACTION  2015   Family History  Problem Relation Age of Onset   Hearing loss Mother    Arthritis Mother    Hyperlipidemia Father    Factor V Leiden deficiency Father    Depression Sister    Anxiety disorder Sister    Migraines Brother    Breast cancer Maternal Aunt    Breast cancer Paternal Aunt    Depression Maternal Grandmother    COPD Maternal Grandmother    Cancer Maternal Grandmother    Ovarian cancer Maternal Grandmother    Cancer Maternal Grandfather    Cancer Paternal Grandmother    Breast cancer Paternal Grandmother    Heart disease Paternal Grandmother    Migraines Paternal Grandfather    Heart disease Paternal Grandfather    Social History   Tobacco Use   Smoking status: Never   Smokeless tobacco: Never  Vaping Use   Vaping status: Never Used  Substance Use Topics   Alcohol use: Not Currently    Comment: socially   Drug use: Never   No Known Allergies Current Outpatient Medications on File Prior to Visit  Medication Sig Dispense Refill   budesonide (RHINOCORT AQUA) 32 MCG/ACT nasal spray Place 2 sprays into both nostrils daily. 8.43 mL 11    cetirizine (ZYRTEC) 10 MG tablet Take 10 mg by mouth daily. (Patient not taking: Reported on 05/25/2023)     Doxylamine-Pyridoxine 10-10 MG TBEC Take 1-2 tablets by mouth at bedtime. (Patient not taking: Reported on 07/06/2023) 60 tablet 3   levocetirizine (XYZAL) 5 MG tablet Take 5 mg by mouth every evening.     Prenatal Vit-Fe Fumarate-FA (MULTIVITAMIN-PRENATAL) 27-0.8 MG TABS tablet Take 1 tablet by mouth daily at 12 noon.     promethazine (PHENERGAN) 25 MG tablet Take 1 tablet (25 mg total) by mouth every 6 (six) hours as needed for nausea or vomiting. 30 tablet 1   sertraline (ZOLOFT) 100 MG tablet Take 1 tablet (100 mg total) by mouth daily. (Patient taking differently: Take 50 mg by mouth daily.) 90 tablet 1   No current facility-administered medications on file prior to visit.    Review of Systems Pertinent items noted in HPI and remainder of comprehensive ROS otherwise negative. Physical Exam:  There were no vitals filed for this visit.    Constitutional: Well-developed, well-nourished pregnant female in no acute distress.  HEENT: PERRLA Skin: normal color and turgor, no rash Cardiovascular: normal rate & rhythm, warm and well perfused Respiratory: normal effort, no problems with respiration noted  GI: Abd soft, non-distended MS: Extremities nontender, no edema, normal ROM Neurologic: Alert and oriented x 4.  GU: no CVA tenderness Pelvic: NEFG, physiologic discharge, no blood, cervix clean. 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 Encounter for supervision of low-risk first pregnancy in first trimester ***  2. [redacted] weeks gestation of pregnancy ***  3. Factor  V Leiden mutation (HCC) ***  4. Inactive tuberculosis ***  5. Anxiety ***  6. Initial obstetric visit in first trimester ***   - Initial labs drawn. - Continue prenatal vitamins. - Problem list reviewed and updated. - Genetic Screening discussed, {Blank multiple:19196::"First trimester screen","Quad screen","NIPS"}: {requests/ordered/declines:14581}. - Ultrasound discussed; fetal anatomic survey: {requests/ordered/declines:14581}. - Anticipatory guidance about prenatal visits given including labs, ultrasounds, and testing. - Discussed usage of Babyscripts and virtual visits as additional source of managing and completing prenatal visits in midst of coronavirus and pandemic.   - Encouraged to complete MyChart Registration for her ability to review results, send requests, and have questions addressed.  - The nature of [place] [facility] Practice with multiple MDs and Advanced Practice Providers was explained to patient; also emphasized that residents, students are part of our team. - Routine obstetric precautions reviewed. Encouraged to seek out care at office or emergency room [facility] preferred) for urgent and/or emergent concerns.  No follow-ups on file.    Future Appointments  Date Time Provider Department Center  07/13/2023  2:15 PM Osborne Oman [facility]  09/07/2023  1:15 PM [facility] NURSE [facility]  09/07/2023  1:30 PM [facility] US1 [facility]    Edd Arbour, MSN, CNM, IBCLC Certified Nurse Midwife, [facility]

## 2023-07-13 ENCOUNTER — Other Ambulatory Visit (HOSPITAL_COMMUNITY)
Admission: RE | Admit: 2023-07-13 | Discharge: 2023-07-13 | Disposition: A | Discharge status: Home / Self Care | Payer: BC Managed Care – PPO | Source: Ambulatory Visit | Attending: Certified Nurse Midwife | Admitting: Certified Nurse Midwife

## 2023-07-13 ENCOUNTER — Ambulatory Visit: Payer: BC Managed Care – PPO | Admitting: Certified Nurse Midwife

## 2023-07-13 VITALS — BP 113/77 | HR 83 | Wt 184.2 lb

## 2023-07-13 DIAGNOSIS — Z3401 Encounter for supervision of normal first pregnancy, first trimester: Secondary | ICD-10-CM

## 2023-07-13 DIAGNOSIS — Z3A11 11 weeks gestation of pregnancy: Secondary | ICD-10-CM

## 2023-07-13 DIAGNOSIS — D6851 Activated protein C resistance: Secondary | ICD-10-CM

## 2023-07-13 DIAGNOSIS — Z3491 Encounter for supervision of normal pregnancy, unspecified, first trimester: Secondary | ICD-10-CM

## 2023-07-13 DIAGNOSIS — Z1332 Encounter for screening for maternal depression: Secondary | ICD-10-CM | POA: Diagnosis not present

## 2023-07-13 DIAGNOSIS — Z227 Latent tuberculosis: Secondary | ICD-10-CM

## 2023-07-13 DIAGNOSIS — F419 Anxiety disorder, unspecified: Secondary | ICD-10-CM

## 2023-07-13 LAB — POCT URINALYSIS DIP (DEVICE)
Bilirubin Urine: NEGATIVE
Glucose, UA: NEGATIVE mg/dL
Hgb urine dipstick: NEGATIVE
Ketones, ur: NEGATIVE mg/dL
Leukocytes,Ua: NEGATIVE
Nitrite: NEGATIVE
Protein, ur: NEGATIVE mg/dL
Specific Gravity, Urine: 1.025 (ref 1.005–1.030)
Urobilinogen, UA: 0.2 mg/dL (ref 0.0–1.0)
pH: 5.5 (ref 5.0–8.0)

## 2023-07-14 LAB — CBC/D/PLT+RPR+RH+ABO+RUBIGG...
Antibody Screen: NEGATIVE
Basophils Absolute: 0 10*3/uL (ref 0.0–0.2)
Basos: 0 %
EOS (ABSOLUTE): 0.3 10*3/uL (ref 0.0–0.4)
Eos: 3 %
HCV Ab: NONREACTIVE
HIV Screen 4th Generation wRfx: NONREACTIVE
Hematocrit: 39.2 % (ref 34.0–46.6)
Hemoglobin: 13.4 g/dL (ref 11.1–15.9)
Hepatitis B Surface Ag: NEGATIVE
Immature Grans (Abs): 0.1 10*3/uL (ref 0.0–0.1)
Immature Granulocytes: 1 %
Lymphocytes Absolute: 2.2 10*3/uL (ref 0.7–3.1)
Lymphs: 22 %
MCH: 30.3 pg (ref 26.6–33.0)
MCHC: 34.2 g/dL (ref 31.5–35.7)
MCV: 89 fL (ref 79–97)
Monocytes Absolute: 0.6 10*3/uL (ref 0.1–0.9)
Monocytes: 6 %
Neutrophils Absolute: 6.7 10*3/uL (ref 1.4–7.0)
Neutrophils: 68 %
Platelets: 242 10*3/uL (ref 150–450)
RBC: 4.42 x10E6/uL (ref 3.77–5.28)
RDW: 11.6 % — ABNORMAL LOW (ref 11.7–15.4)
RPR Ser Ql: NONREACTIVE
Rh Factor: POSITIVE
Rubella Antibodies, IGG: 1 {index} (ref >0.99)
WBC: 9.8 10*3/uL (ref 3.4–10.8)

## 2023-07-14 LAB — HEMOGLOBIN A1C
Est. average glucose Bld gHb Est-mCnc: 100 mg/dL
Hgb A1c MFr Bld: 5.1 % (ref 4.8–5.6)

## 2023-07-14 LAB — HCV INTERPRETATION

## 2023-07-15 LAB — CERVICOVAGINAL ANCILLARY ONLY
Chlamydia: NEGATIVE
Comment: NEGATIVE
Comment: NORMAL
Neisseria Gonorrhea: NEGATIVE

## 2023-07-15 LAB — URINE CULTURE, OB REFLEX

## 2023-07-15 LAB — CULTURE, OB URINE

## 2023-07-21 LAB — PANORAMA PRENATAL TEST FULL PANEL:PANORAMA TEST PLUS 5 ADDITIONAL MICRODELETIONS: FETAL FRACTION: 10.3

## 2023-07-22 LAB — HORIZON CUSTOM: REPORT SUMMARY: NEGATIVE

## 2023-08-08 ENCOUNTER — Other Ambulatory Visit: Payer: Self-pay | Admitting: Internal Medicine

## 2023-08-08 MED ORDER — SERTRALINE HCL 100 MG PO TABS: 50.0000 mg | ORAL_TABLET | Freq: Every day | ORAL | 0 refills | Status: DC

## 2023-08-08 NOTE — Telephone Encounter (Signed)
Patient states she is calling to schedule an appointment for her medication refill.  Patient states she needs a Wednesday afternoon, if possible.  I scheduled her for next available on 09/14/2023.  I spoke with Sandy Salaam, CMA, and she states she will send in a refill for patient's medication to get her through to her appointment with Dr. Duncan Dull.

## 2023-08-08 NOTE — Telephone Encounter (Signed)
Pt is currently pregnant. Is it okay to refill the Zoloft until her appt with you on 09/14/2023?

## 2023-08-09 NOTE — Progress Notes (Signed)
Opened in error

## 2023-08-10 ENCOUNTER — Other Ambulatory Visit: Payer: Self-pay

## 2023-08-10 ENCOUNTER — Ambulatory Visit: Payer: BC Managed Care – PPO | Admitting: Certified Nurse Midwife

## 2023-08-10 VITALS — BP 116/64 | HR 86 | Wt 192.0 lb

## 2023-08-10 DIAGNOSIS — Z3402 Encounter for supervision of normal first pregnancy, second trimester: Secondary | ICD-10-CM

## 2023-08-10 DIAGNOSIS — R102 Pelvic and perineal pain: Secondary | ICD-10-CM

## 2023-08-10 DIAGNOSIS — Z3A15 15 weeks gestation of pregnancy: Secondary | ICD-10-CM

## 2023-08-10 DIAGNOSIS — O26892 Other specified pregnancy related conditions, second trimester: Secondary | ICD-10-CM

## 2023-08-10 NOTE — Progress Notes (Addendum)
   PRENATAL VISIT NOTE  Subjective:  Valerie Bates is a 27 y.o. G1P0000 at [redacted]w[redacted]d being seen today for ongoing prenatal care.  She is currently monitored for the following issues for this low-risk pregnancy and has Anxiety; Factor V Leiden mutation (HCC); Allergic rhinitis; Supervision of low-risk first pregnancy; Depression; and Inactive tuberculosis on their problem list.  Patient reports unilateral R hip pain that she has had after many years of playing sports. Feels pain is exacerbated by pregnancy.  Contractions: Not present. Vag. Bleeding: None.  Movement: Present. Denies leaking of fluid.   The following portions of the patient's history were reviewed and updated as appropriate: allergies, current medications, past family history, past medical history, past social history, past surgical history and problem list.   Objective:   Vitals:   08/10/23 1432  BP: 116/64  Pulse: 86  Weight: 192 lb (87.1 kg)    Fetal Status: Fetal Heart Rate (bpm): 141   Movement: Present     General:  Alert, oriented and cooperative. Patient is in no acute distress.  Skin: Skin is warm and dry. No rash noted.   Cardiovascular: Normal heart rate noted  Respiratory: Normal respiratory effort, no problems with respiration noted  Abdomen: Soft, gravid, appropriate for gestational age.  Pain/Pressure: Absent     Pelvic: Cervical exam deferred        Extremities: Normal range of motion.  Edema: None  Mental Status: Normal mood and affect. Normal behavior. Normal judgment and thought content.   Assessment and Plan:  Pregnancy: G1P0000 at [redacted]w[redacted]d 1. Encounter for supervision of low-risk first pregnancy in second trimester (Primary) - Patient feeling well. FHR appropriate. Maternal vitals wnl.   2. [redacted] weeks gestation of pregnancy - Anticipatory guidance about next visits/weeks of pregnancy given.  - AFP, Serum, Open Spina Bifida  3. Pelvic pain during pregnancy in second trimester, antepartum -  Unilateral, sharp, right-sided pain, likely round ligament.  - Ambulatory referral to Physical Therapy   Preterm labor symptoms and general obstetric precautions including but not limited to vaginal bleeding, contractions, leaking of fluid and fetal movement were reviewed in detail with the patient.  Please refer to After Visit Summary for other counseling recommendations.   Return in about 2 weeks (around 08/24/2023) for LOB.  Future Appointments  Date Time Provider Department Center  08/25/2023  3:55 PM Osborne Oman [facility]  09/07/2023  1:15 PM [facility] NURSE [facility]  09/07/2023  1:30 PM [facility] US1 [facility]  09/14/2023  2:00 PM Sherlene Shams, MD [facility]    Ralene [place]rat, [place]

## 2023-08-12 LAB — AFP, SERUM, OPEN SPINA BIFIDA
AFP MoM: 0.81
AFP Value: 21.8 ng/mL
Gest. Age on Collection Date: 15.6 wk
Maternal Age At EDD: 28.1 a
OSBR Risk 1 IN: 10000
Test Results:: NEGATIVE
Weight: 192 [lb_av]

## 2023-08-15 ENCOUNTER — Encounter: Payer: Self-pay | Admitting: Certified Nurse Midwife

## 2023-08-15 DIAGNOSIS — O219 Vomiting of pregnancy, unspecified: Secondary | ICD-10-CM

## 2023-08-15 MED ORDER — ONDANSETRON 4 MG PO TBDP: 4.0000 mg | ORAL_TABLET | Freq: Three times a day (TID) | ORAL | 0 refills | Status: DC | PRN

## 2023-08-25 ENCOUNTER — Encounter: Payer: BC Managed Care – PPO | Admitting: Certified Nurse Midwife

## 2023-09-01 ENCOUNTER — Encounter: Payer: Self-pay | Admitting: *Deleted

## 2023-09-06 NOTE — Progress Notes (Signed)
   PRENATAL VISIT NOTE  Subjective:  Valerie Bates is a 28 y.o. G1P0000 at [redacted]w[redacted]d being seen today for ongoing prenatal care.  She is currently monitored for the following issues for this high-risk pregnancy and has Anxiety; Factor V Leiden mutation (HCC); Supervision of high risk pregnancy, antepartum, second trimester; Depression; and Inactive tuberculosis on their problem list.  Patient reports no complaints.  Contractions: Not present. Vag. Bleeding: None.  Movement: Present. Denies leaking of fluid.   The following portions of the patient's history were reviewed and updated as appropriate: allergies, current medications, past family history, past medical history, past social history, past surgical history and problem list.   Objective:   Vitals:   09/07/23 1536  BP: 124/69  Pulse: 67  Weight: 196 lb (88.9 kg)    Fetal Status: Fetal Heart Rate (bpm): 152   Movement: Present     General:  Alert, oriented and cooperative. Patient is in no acute distress.  Skin: Skin is warm and dry. No rash noted.   Cardiovascular: Normal heart rate noted  Respiratory: Normal respiratory effort, no problems with respiration noted  Abdomen: Soft, gravid, appropriate for gestational age.  Pain/Pressure: Absent     Pelvic: Cervical exam deferred        Extremities: Normal range of motion.  Edema: None  Mental Status: Normal mood and affect. Normal behavior. Normal judgment and thought content.   Assessment and Plan:  Pregnancy: G1P0000 at [redacted]w[redacted]d 1. Encounter for supervision of low-risk first pregnancy in second trimester (Primary) - Feeling much better for the past two weeks, starting to feel slight fetal movement  2. [redacted] weeks gestation of pregnancy - Routine OB care, had anatomy scan today  Preterm labor symptoms and general obstetric precautions including but not limited to vaginal bleeding, contractions, leaking of fluid and fetal movement were reviewed in detail with the patient. Please  refer to After Visit Summary for other counseling recommendations.   Return in about 5 weeks (around 10/12/2023) for IN-PERSON, LOB.  Future Appointments  Date Time Provider Department Center  09/14/2023  2:00 PM Thersia Flax, MD [facility]  10/12/2023  1:30 PM [facility] US4 [facility]  10/12/2023  2:35 PM Derick Fleeting, CNM [place] [facility]  11/24/2023 10:15 AM Modesto Andreas, PT [facility] None  11/30/2023  1:30 PM Modesto Andreas, PT [facility] None  12/07/2023  1:30 PM Modesto Andreas, PT [facility] None  12/14/2023  1:30 PM Modesto Andreas, PT [facility] None  12/21/2023  1:30 PM Modesto Andreas, PT [facility] None  12/28/2023  1:30 PM Janel Medford, Shin-Yiing, PT [facility] None  01/04/2024  1:30 PM Modesto Andreas, PT [facility] None  01/25/2024  1:30 PM Modesto Andreas, PT [facility] None  02/01/2024  1:30 PM Modesto Andreas, PT [facility] None   Derick Fleeting, CNM

## 2023-09-07 ENCOUNTER — Ambulatory Visit: Payer: BC Managed Care – PPO | Attending: Certified Nurse Midwife

## 2023-09-07 ENCOUNTER — Other Ambulatory Visit: Payer: Self-pay

## 2023-09-07 ENCOUNTER — Ambulatory Visit: Payer: BC Managed Care – PPO

## 2023-09-07 ENCOUNTER — Other Ambulatory Visit: Payer: Self-pay | Admitting: *Deleted

## 2023-09-07 ENCOUNTER — Ambulatory Visit: Payer: BC Managed Care – PPO | Admitting: Certified Nurse Midwife

## 2023-09-07 ENCOUNTER — Encounter: Payer: Self-pay | Admitting: *Deleted

## 2023-09-07 ENCOUNTER — Ambulatory Visit (HOSPITAL_BASED_OUTPATIENT_CLINIC_OR_DEPARTMENT_OTHER): Payer: BC Managed Care – PPO | Admitting: Maternal & Fetal Medicine

## 2023-09-07 VITALS — BP 124/69 | HR 67 | Wt 196.0 lb

## 2023-09-07 DIAGNOSIS — O99112 Other diseases of the blood and blood-forming organs and certain disorders involving the immune mechanism complicating pregnancy, second trimester: Secondary | ICD-10-CM | POA: Diagnosis not present

## 2023-09-07 DIAGNOSIS — O0992 Supervision of high risk pregnancy, unspecified, second trimester: Secondary | ICD-10-CM | POA: Diagnosis not present

## 2023-09-07 DIAGNOSIS — O0991 Supervision of high risk pregnancy, unspecified, first trimester: Secondary | ICD-10-CM | POA: Diagnosis present

## 2023-09-07 DIAGNOSIS — Z3402 Encounter for supervision of normal first pregnancy, second trimester: Secondary | ICD-10-CM | POA: Diagnosis not present

## 2023-09-07 DIAGNOSIS — Z3A19 19 weeks gestation of pregnancy: Secondary | ICD-10-CM

## 2023-09-07 DIAGNOSIS — D6851 Activated protein C resistance: Secondary | ICD-10-CM

## 2023-09-07 DIAGNOSIS — Z362 Encounter for other antenatal screening follow-up: Secondary | ICD-10-CM

## 2023-09-07 NOTE — Progress Notes (Unsigned)
 Opened in error

## 2023-09-07 NOTE — Patient Instructions (Signed)
 [facility] - Valerie Bates  We highly recommend childbirth education to help you plan for labor and begin practicing coping skills (which will be needed with or without pain meds).  [place] Childbirth Education Options: Sign up by visiting [email or web address]  Childbirth ~ Self-Paced eClass (English and Spanish) This online class offers you the freedom to complete a childbirth education series in the comfort of your own home at your own pace.  Childbirth Class (In-Person 4-Week Series  or on Saturdays, Virtual 4-Week Series ~ [place]) This interactive in-person class series will help you and your partner prepare for your birth experience. Topics include: Labor & Birth, Comfort Measures, Breathing Techniques, Massage, Medical Interventions, Pain Management Options, Cesarean Birth, Postpartum Care, and Newborn Care  Comfort Techniques for Labor ~ In-Person Class [facility]) This interactive class is designed for parents-to-be who want to learn & practice hands-on skills to help relieve some of the discomfort of labor and encourage their babies to rotate toward the best position for birth. Moms and their partners will be able to try a variety of labor positions with birth balls and rebozos as well as practice breathing, relaxation, and visualization techniques.  Natural Childbirth Class (In-Person 5-Week Series, In-Person on Saturdays or Virtual 5-Week Series ~ [place]) This class series is designed for expectant parents who want to learn and practice natural methods of coping with the process of labor and childbirth.  Cesarean Birth Self-Paced eClass (English and Spanish) This online course provides comprehensive information you can trust as you prepare for a possible cesarean birth. In this class, you'll learn how to make your birth and recovery comfortable and joyful through instructive video clips, animations, and activities.  Waterbirth ~ Airline pilot Interested in a  waterbirth? In addition to a consultation with your credentialed waterbirth provider, this free, informational online class will help you discover whether waterbirth is the right fit for you. Not all obstetrical practices offer waterbirth, so check with your healthcare provider.  Tour Probation officer) - [facility] [employer] our 4 minute video tour of [facility] located in [place].   [place] Parenting Education Options:  Pregnancy 101 (Virtual) Congratulations on your pregnancy! This class is geared toward moms in their first trimester, but everyone is welcome. We are excited to guide you through all aspects of supporting a healthy pregnancy. You will learn what to expect at routine prenatal care appointments, common postpartum adjustments, basic infant safety, and breastfeeding.  Successful Partnering & Parenting ~ In-Person Workshop [facility]) This workshop inspires and equips partners of all economic levels, ages, and cultures to confidently care for their infants, support the birthing persons, and navigate their own transformations into new partners and parents. Learning activities are geared towards supporting partner, but moms are welcome to attend.  'Baby & Me' Parenting Group (Virtual on Wednesdays at 11am) Enjoy this time discussing newborn & infant parenting topics and family adjustment issues with other new parents in a relaxed environment. Each week brings a new speaker or baby-centered activity. This group offers support and connection to parents as they journey through the adjustments and struggles of that sometimes overwhelming first year after the birth of a child.  Baby Safety, CPR, & Choking Class ~ Virtual This life-saving information is meant to encourage parents as they learn important safety and prevention tips as well as infant CPR and relief of choking.  Breastfeeding Class (In-Person in [place] or  Virtual) Families learn what to expect in the  first days and weeks of breastfeeding your newborn.   Breastfeeding Self-Paced eClass (English & Spanish) Families learn what to expect in the first days and weeks of breastfeeding your newborn.  Caring for Baby ~ In-Person, Virtual or Self-Paced Class This in-person class is for both expectant and adoptive parents who want to learn and practice the most up-to-date newborn care for their babies. Focus is on birth through the first six weeks of life.  CPR & Choking Relief for Infants & Children ~ In-Person Class [facility]) This in-person course is designed for any parent, expectant parent, or adult who cares for infants or children. Participants learn and demonstrate cardiopulmonary resuscitation and choking relief procedures for both infants and children.  Grandparent Love ~ In-Person Class Grandparents will learn the most updated infant care and safety recommendations. They will discover ways to support their own children during the transition into the parenting role and receive tips on communicating with the new parents.  [place] Parenting Support Group Options:  Bereavement Grief Support Group (Pregnancy/Infant Loss) - Virtual This is an ongoing experience that meets once a month and is designed to help you honor the past, assist you in discovering tools to strengthen you today, and aid you in developing hope for the future.  Breastfeeding & Pumping Support Group (In-Person on Thursdays at 12pm or Virtual on Tuesdays at 5pm) Join us  in-person each Thursday starting June 1st, 2023 at 12pm! This support group is free for all families looking for breastfeeding and/or pumping support.   Community-Based Childbirth Education Options:  [facility] Department Classes:  Childbirth education classes can help you get ready for a positive parenting experience. You can also meet other expectant parents and get free stuff for your baby.  Each class runs for five weeks on the same night and costs $45 for the mother-to-be and her support person. Medicaid covers the cost if you are eligible. Call [phone] to register.  [facility] [employer] offers a variety of programs for the [employer] and is another great way to get connected. Please go to [email or web address] for more information.  Childbirth With A Twist! Be informed of your options, get educated on birth, understand what your body is doing, learn how to cope, and have a lot of fun and laughs all while doing it either from the comfort of your couch OR in our cozy office and classroom space near the [place] airport. If you are taking a virtual class, then class is taught LIVE, so you can ask questions and receive answers in real-time from an experienced doula and childbirth educator.  This virtual childbirth education class will meet for five instruction times online.  Although we are based in [place], [place], this virtual class is open to anyone in the world. Please visit: [email or web address] for more information.  Books We Love: The Doula Guide to Childbirth by Arvin Laundry and Alisa App The First-Time Parent's Childbirth Handbook by Dr. Andy Keen, CNM The Birth Partner by Coleen Dauer

## 2023-09-07 NOTE — Progress Notes (Signed)
 Patient information  Patient Name: Valerie Bates  Patient MRN:   [id number]  Referring practice: MFM Referring Provider: [facility])  MFM CONSULT  Valerie Bates is a 28 y.o. G1P0000 at [redacted]w[redacted]d here for ultrasound and consultation. 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  He was tested due to hist sisters child dying from a fatal PE. 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  She denies any personal history of thromboembolic phenomenon.  Factor V Leiden is a genetic mutation that renders factor V to be refractory to proteolysis by activated protein C.  This leads to an increase in risk of blood clot formation.  Approximately 5% of Caucasians and 1% of African descendents have such a mutation.  In all cases of VTE during pregnancy approximately 40% will be heterozygous for factor V Leiden.  Despite this high prevalence the main risk factor for thromboembolism during pregnancy is a personal or family history of VTE.  In the absence of a personal or family history of VTE the risk is about 1% of a VTE during pregnancy compared to 10% if there is a personal history of VTE.  Patients with homozygous factor V Leiden only have a slightly increased risks of VTE ~1.5%) in the absence of a personal history of VTE.  If patients are homozygous for factor V Leiden and have a personal or family history of a blood clot then this risk goes up to 17%.  Since the risk is overall very low for a blood clot during pregnancy, Lovenox is not indicated at this time unless additional risk factors are present.  If the patient requires a cesarean delivery then anticoagulation can be considered in the postpartum  period until the patient is ambulatory.  Sonographic findings Single intrauterine pregnancy at 19w 5d  Fetal cardiac activity:  Observed and appears normal. Presentation: Breech. The anatomic structures that were well seen appear normal without evidence of soft markers. Due to poor acoustic windows some structures remain suboptimally visualized. Fetal biometry shows the estimated fetal weight at the 44 percentile.  Amniotic fluid: Within normal limits.  MVP: 4.64 cm. Placenta: Anterior. Adnexa: No abnormality visualized. Cervical length: 3.2 cm.  Recommendations -EDD is Estimated Date of Delivery: 01/26/24. -Detailed ultrasound was done today without abnormalities.  Some of the fetal anatomy remain suboptimally visualized. -Anticoagulation is not indicated at this time.  If additional risk factors arise please consider anticoagulation. Consult MFM with any concerns. -Continue Aspirin 81 mg for preeclampsia prophylaxis. -Follow-up anatomy and fetal growth in 4 to 6 weeks. -Continue routine prenatal care with referring OB provider.  Review of Systems: A review of systems was performed and was negative except per HPI   Vitals and Physical Exam    09/07/2023    1:03 PM 08/10/2023    2:32 PM 07/13/2023    2:40 PM  Vitals with BMI  Weight  192 lbs 184 lbs 4 oz  Systolic 123 116 409  Diastolic 64 64 77  Pulse 80 86 83  Sitting comfortably on the sonogram table Nonlabored breathing Normal rate and rhythm Abdomen is nontender  Past pregnancies OB History  Gravida Para Term Preterm AB Living  1 0 0 0  0 0  SAB IAB Ectopic Multiple Live Births  0 0 0 0 0    # Outcome Date GA Lbr Len/2nd Weight Sex Type Anes PTL Lv  1 Current             I spent 30 minutes reviewing the patients chart, including labs and images as well as counseling the patient about her medical conditions. Greater than 50% of the time was spent in direct face-to-face patient counseling.  Penney Bowling  MFM, [facility]   09/07/2023  1:07 PM

## 2023-09-12 ENCOUNTER — Ambulatory Visit: Payer: BC Managed Care – PPO | Admitting: Internal Medicine

## 2023-09-14 ENCOUNTER — Ambulatory Visit: Payer: BC Managed Care – PPO | Admitting: Internal Medicine

## 2023-09-14 VITALS — BP 120/72 | HR 89 | Ht 67.0 in | Wt 195.0 lb

## 2023-09-14 DIAGNOSIS — F419 Anxiety disorder, unspecified: Secondary | ICD-10-CM

## 2023-09-14 DIAGNOSIS — Z227 Latent tuberculosis: Secondary | ICD-10-CM | POA: Diagnosis not present

## 2023-09-14 DIAGNOSIS — D6851 Activated protein C resistance: Secondary | ICD-10-CM

## 2023-09-14 MED ORDER — SERTRALINE HCL 100 MG PO TABS: 50.0000 mg | ORAL_TABLET | Freq: Every day | ORAL | 0 refills | Status: DC

## 2023-09-14 NOTE — Progress Notes (Signed)
Subjective:  Patient ID: Valerie Bates, female    DOB: [birth year]  Age: 28 y.o. MRN: [id number]  CC: The primary encounter diagnosis was Anxiety. Diagnoses of Inactive tuberculosis and Factor V Leiden mutation [facility]) were also pertinent to this visit.   HPI Valerie Bates presents for  Chief Complaint  Patient presents with   Medical Management of Chronic Issues    Medication refill   1) Depression/anxiety:  taking sertraline ,has reduce dose to 50 mg daily and seeing a Haematologist.  Mood is stable  ,  currently   [redacted] weeks pregnant, intended  pregnancy,  expecting a girl.    2) Allergic rhinitis:  using xyzal and prn rhinocort.   3) insomnia; using unisom  prn  4) Factor V Leiden mutation: no history of DVT..  high risk pregnancy management        Outpatient Medications Prior to Visit  Medication Sig Dispense Refill   budesonide (RHINOCORT AQUA) 32 MCG/ACT nasal spray Place 2 sprays into both nostrils daily. 8.43 mL 11   Doxylamine-Pyridoxine 10-10 MG TBEC Take 1-2 tablets by mouth at bedtime. 60 tablet 3   levocetirizine (XYZAL) 5 MG tablet Take 5 mg by mouth every evening.     ondansetron (ZOFRAN-ODT) 4 MG disintegrating tablet Take 1 tablet (4 mg total) by mouth every 8 (eight) hours as needed for nausea or vomiting. 20 tablet 0   Prenatal Vit-Fe Fumarate-FA (MULTIVITAMIN-PRENATAL) 27-0.8 MG TABS tablet Take 1 tablet by mouth daily at 12 noon.     promethazine (PHENERGAN) 25 MG tablet Take 1 tablet (25 mg total) by mouth every 6 (six) hours as needed for nausea or vomiting. 30 tablet 1   sertraline (ZOLOFT) 100 MG tablet Take 0.5 tablets (50 mg total) by mouth daily. 90 tablet 0   No facility-administered medications prior to visit.    Review of Systems;  Patient denies headache, fevers, malaise, unintentional weight loss, skin rash, eye pain, sinus congestion and sinus pain, sore throat, dysphagia,  hemoptysis , cough, dyspnea, wheezing, chest pain,  palpitations, orthopnea, edema, abdominal pain, nausea, melena, diarrhea, constipation, flank pain, dysuria, hematuria, urinary  Frequency, nocturia, numbness, tingling, seizures,  Focal weakness, Loss of consciousness,  Tremor, insomnia, depression, anxiety, and suicidal ideation.      Objective:  BP 120/72   Pulse 89   Ht 5\' 7"  (1.702 m)   Wt 195 lb (88.5 kg)   LMP 04/21/2023 (Exact Date)   SpO2 98%   BMI 30.54 kg/m   BP Readings from Last 3 Encounters:  09/14/23 120/72  09/07/23 124/69  09/07/23 123/64    Wt Readings from Last 3 Encounters:  09/14/23 195 lb (88.5 kg)  09/07/23 196 lb (88.9 kg)  08/10/23 192 lb (87.1 kg)    Physical Exam Vitals reviewed.  Constitutional:      General: She is not in acute distress.    Appearance: Normal appearance. She is normal weight. She is not ill-appearing, toxic-appearing or diaphoretic.  HENT:     Head: Normocephalic.  Eyes:     General: No scleral icterus.       Right eye: No discharge.        Left eye: No discharge.     Conjunctiva/sclera: Conjunctivae normal.  Cardiovascular:     Rate and Rhythm: Normal rate and regular rhythm.     Heart sounds: Normal heart sounds.  Pulmonary:     Effort: Pulmonary effort is normal. No respiratory distress.     Breath sounds: Normal breath  sounds.  Musculoskeletal:        General: Normal range of motion.  Skin:    General: Skin is warm and dry.  Neurological:     General: No focal deficit present.     Mental Status: She is alert and oriented to person, place, and time. Mental status is at baseline.  Psychiatric:        Mood and Affect: Mood normal.        Behavior: Behavior normal.        Thought Content: Thought content normal.        Judgment: Judgment normal.    Lab Results  Component Value Date   HGBA1C 5.1 07/13/2023   HGBA1C 5.5 06/21/2022    Lab Results  Component Value Date   CREATININE 0.70 06/21/2022    Lab Results  Component Value Date   WBC 9.8 07/13/2023    HGB 13.4 07/13/2023   HCT 39.2 07/13/2023   PLT 242 07/13/2023   GLUCOSE 78 06/21/2022   CHOL 151 06/21/2022   TRIG 62.0 06/21/2022   HDL 60.40 06/21/2022   LDLDIRECT 76.0 06/21/2022   LDLCALC 78 06/21/2022   ALT 11 06/21/2022   AST 13 06/21/2022   NA 138 06/21/2022   K 4.1 06/21/2022   CL 105 06/21/2022   CREATININE 0.70 06/21/2022   BUN 18 06/21/2022   CO2 25 06/21/2022   TSH 2.10 06/21/2022   HGBA1C 5.1 07/13/2023    No results found.  Assessment & Plan:  .Anxiety Assessment & Plan: Prior trials of lexapro were ineffective and wellbutrin  caused a tremor.  Symptoms are currently managed with 50mg   zoloft and exercise. She reports no recent history of  panic attacks.  Advised to continue medication through her pregnancy   Inactive tuberculosis Assessment & Plan: She completed isoniazid x 9 months for positive TB skin test,  negative CXR     Factor V Leiden mutation [facility]) Assessment & Plan: Found during screening after a family member was diagnosed with PE>  She is heterozygous and is avoiding OCPS but is [redacted] weeks pregnant.  Her pregnancy is being managed at the high risk clinic   Other orders -     Sertraline HCl; Take 0.5 tablets (50 mg total) by mouth daily.  Dispense: 90 tablet; Refill: 0     Follow-up: No follow-ups on file.   Sherlene Shams, MD

## 2023-09-14 NOTE — Assessment & Plan Note (Signed)
Found during screening after a family member was diagnosed with PE>  She is heterozygous and is avoiding OCPS but is [redacted] weeks pregnant.  Her pregnancy is being managed at the high risk clinic

## 2023-09-14 NOTE — Assessment & Plan Note (Signed)
Prior trials of lexapro were ineffective and wellbutrin  caused a tremor.  Symptoms are currently managed with 50mg   zoloft and exercise. She reports no recent history of  panic attacks.  Advised to continue medication through her pregnancy

## 2023-09-14 NOTE — Patient Instructions (Addendum)
Conratulations!    You might want to try using Relaxium for insomnia  (as seen on TV commercials) . It is available through [employer] and contains all natural supplements:  Melatonin 5 mg  Chamomile 25 mg Passionflower extract 75 mg GABA 100 mg Ashwaganda extract 125 mg Magnesium citrate, glycinate, oxide (100 mg)  L tryptophan 500 mg Valerest (proprietary  ingredient ; probably valeria root extract)

## 2023-09-14 NOTE — Assessment & Plan Note (Signed)
She completed isoniazid x 9 months for positive TB skin test,  negative CXR

## 2023-10-05 ENCOUNTER — Encounter: Payer: Self-pay | Admitting: Certified Nurse Midwife

## 2023-10-11 ENCOUNTER — Telehealth: Payer: Self-pay

## 2023-10-12 ENCOUNTER — Ambulatory Visit: Payer: BC Managed Care – PPO

## 2023-10-12 ENCOUNTER — Encounter: Payer: BC Managed Care – PPO | Admitting: Certified Nurse Midwife

## 2023-10-18 NOTE — Progress Notes (Signed)
   PRENATAL VISIT NOTE  Subjective:  Valerie Bates is a 28 y.o. G1P0000 at [redacted]w[redacted]d being seen today for ongoing prenatal care.  She is currently monitored for the following issues for this low-risk pregnancy and has Anxiety; Factor V Leiden mutation (HCC); Supervision of low-risk pregnancy; Depression; and Inactive tuberculosis on their problem list.  Patient reports no complaints.  Contractions: Not present. Vag. Bleeding: None.  Movement: Present. Denies leaking of fluid.   The following portions of the patient's history were reviewed and updated as appropriate: allergies, current medications, past family history, past medical history, past social history, past surgical history and problem list.   Objective:   Vitals:   10/19/23 1337  BP: 116/74  Pulse: 75  Weight: 202 lb 8 oz (91.9 kg)    Fetal Status: Fetal Heart Rate (bpm): 130 Fundal Height: 26 cm Movement: Present     General:  Alert, oriented and cooperative. Patient is in no acute distress.  Skin: Skin is warm and dry. No rash noted.   Cardiovascular: Normal heart rate noted  Respiratory: Normal respiratory effort, no problems with respiration noted  Abdomen: Soft, gravid, appropriate for gestational age.  Pain/Pressure: Absent     Pelvic: Cervical exam deferred        Extremities: Normal range of motion.  Edema: None  Mental Status: Normal mood and affect. Normal behavior. Normal judgment and thought content.   Assessment and Plan:  Pregnancy: G1P0000 at [redacted]w[redacted]d 1. Encounter for supervision of low-risk pregnancy in second trimester (Primary) - Doing well, feeling regular and vigorous fetal movement - Recently had the flu but is recovering well  2. [redacted] weeks gestation of pregnancy - Routine OB care including anticipatory guidance re GTT at next visit  3. Factor V Leiden mutation (HCC) - Taking daily aspirin  Preterm labor symptoms and general obstetric precautions including but not limited to vaginal bleeding,  contractions, leaking of fluid and fetal movement were reviewed in detail with the patient. Please refer to After Visit Summary for other counseling recommendations.   Return in about 2 weeks (around 11/02/2023) for IN-PERSON, LOB/GTT.  Future Appointments  Date Time Provider Department Center  11/02/2023  8:50 AM [facility]  11/02/2023  9:55 AM Bernerd Limbo, CNM [facility]  11/02/2023  1:30 PM [facility] US6 [facility]  11/16/2023  1:15 PM Bernerd Limbo, CNM [facility]  11/24/2023 10:15 AM Dayle Points, Shin-Yiing, PT [facility] None  11/30/2023  1:30 PM Dayle Points, Shin-Yiing, PT [facility] None  12/07/2023  1:30 PM Dayle Points, Shin-Yiing, PT [facility] None  12/14/2023  1:30 PM Dayle Points, Shin-Yiing, PT [facility] None  12/21/2023  1:30 PM Dayle Points, Shin-Yiing, PT [facility] None  12/28/2023  1:30 PM Dayle Points, Shin-Yiing, PT [facility] None  01/04/2024  1:30 PM Mariane Masters, PT [facility] None  01/25/2024  1:30 PM Mariane Masters, PT [facility] None  02/01/2024  1:30 PM Mariane Masters, PT [facility] None    Bernerd Limbo, CNM

## 2023-10-19 ENCOUNTER — Ambulatory Visit (INDEPENDENT_AMBULATORY_CARE_PROVIDER_SITE_OTHER): Payer: BC Managed Care – PPO | Admitting: Certified Nurse Midwife

## 2023-10-19 ENCOUNTER — Other Ambulatory Visit: Payer: Self-pay

## 2023-10-19 VITALS — BP 116/74 | HR 75 | Wt 202.5 lb

## 2023-10-19 DIAGNOSIS — D6851 Activated protein C resistance: Secondary | ICD-10-CM | POA: Diagnosis not present

## 2023-10-19 DIAGNOSIS — Z3A26 26 weeks gestation of pregnancy: Secondary | ICD-10-CM | POA: Diagnosis not present

## 2023-10-19 DIAGNOSIS — Z3492 Encounter for supervision of normal pregnancy, unspecified, second trimester: Secondary | ICD-10-CM

## 2023-11-01 NOTE — Progress Notes (Unsigned)
   PRENATAL VISIT NOTE  Subjective:  Valerie Bates is a 28 y.o. G1P0000 at [redacted]w[redacted]d being seen today for ongoing prenatal care.  She is currently monitored for the following issues for this {Blank single:19197::"high-risk","low-risk"} pregnancy and has Anxiety; Factor V Leiden mutation (HCC); Supervision of low-risk pregnancy; Depression; and Inactive tuberculosis on their problem list.  Patient reports {sx:14538}.   .  .   . Denies leaking of fluid.   The following portions of the patient's history were reviewed and updated as appropriate: allergies, current medications, past family history, past medical history, past social history, past surgical history and problem list.   Objective:  There were no vitals filed for this visit.  Fetal Status:           General:  Alert, oriented and cooperative. Patient is in no acute distress.  Skin: Skin is warm and dry. No rash noted.   Cardiovascular: Normal heart rate noted  Respiratory: Normal respiratory effort, no problems with respiration noted  Abdomen: Soft, gravid, appropriate for gestational age.        Pelvic: {Blank single:19197::"Cervical exam performed in the presence of a chaperone","Cervical exam deferred"}        Extremities: Normal range of motion.     Mental Status: Normal mood and affect. Normal behavior. Normal judgment and thought content.   Assessment and Plan:  Pregnancy: G1P0000 at [redacted]w[redacted]d 1. Encounter for supervision of low-risk pregnancy in second trimester (Primary) ***  2. [redacted] weeks gestation of pregnancy ***  {Blank single:19197::"Term","Preterm"} labor symptoms and general obstetric precautions including but not limited to vaginal bleeding, contractions, leaking of fluid and fetal movement were reviewed in detail with the patient. Please refer to After Visit Summary for other counseling recommendations.   No follow-ups on file.  Future Appointments  Date Time Provider Department Center  11/02/2023  8:50 AM  [facility]  11/02/2023  9:55 AM Bernerd Limbo, CNM [facility]  11/02/2023  1:30 PM [facility] US6 [facility]  11/16/2023  1:15 PM Bernerd Limbo, CNM [facility]  11/24/2023 10:15 AM Dayle Points, Shin-Yiing, PT [facility] None  11/30/2023  1:30 PM Dayle Points, Shin-Yiing, PT [facility] None  12/07/2023  1:30 PM Dayle Points, Shin-Yiing, PT [facility] None  12/14/2023  1:30 PM Dayle Points, Shin-Yiing, PT [facility] None  12/21/2023  1:30 PM Dayle Points, Shin-Yiing, PT [facility] None  12/28/2023  1:30 PM Dayle Points, Shin-Yiing, PT [facility] None  01/04/2024  1:30 PM Mariane Masters, PT [facility] None  01/25/2024  1:30 PM Mariane Masters, PT [facility] None  02/01/2024  1:30 PM Mariane Masters, PT [facility] None    Bernerd Limbo, CNM

## 2023-11-02 ENCOUNTER — Other Ambulatory Visit: Payer: Self-pay

## 2023-11-02 ENCOUNTER — Ambulatory Visit (INDEPENDENT_AMBULATORY_CARE_PROVIDER_SITE_OTHER): Payer: Self-pay | Admitting: Certified Nurse Midwife

## 2023-11-02 ENCOUNTER — Ambulatory Visit: Payer: BC Managed Care – PPO | Attending: Maternal & Fetal Medicine

## 2023-11-02 VITALS — BP 127/84 | HR 90 | Wt 201.3 lb

## 2023-11-02 DIAGNOSIS — Z3A27 27 weeks gestation of pregnancy: Secondary | ICD-10-CM

## 2023-11-02 DIAGNOSIS — O99113 Other diseases of the blood and blood-forming organs and certain disorders involving the immune mechanism complicating pregnancy, third trimester: Secondary | ICD-10-CM

## 2023-11-02 DIAGNOSIS — Z3492 Encounter for supervision of normal pregnancy, unspecified, second trimester: Secondary | ICD-10-CM | POA: Diagnosis not present

## 2023-11-02 DIAGNOSIS — Z362 Encounter for other antenatal screening follow-up: Secondary | ICD-10-CM | POA: Insufficient documentation

## 2023-11-02 DIAGNOSIS — D6851 Activated protein C resistance: Secondary | ICD-10-CM

## 2023-11-03 LAB — CBC
Hematocrit: 37.1 % (ref 34.0–46.6)
Hemoglobin: 12.9 g/dL (ref 11.1–15.9)
MCH: 31.1 pg (ref 26.6–33.0)
MCHC: 34.8 g/dL (ref 31.5–35.7)
MCV: 89 fL (ref 79–97)
Platelets: 238 10*3/uL (ref 150–450)
RBC: 4.15 x10E6/uL (ref 3.77–5.28)
RDW: 12.3 % (ref 11.7–15.4)
WBC: 7.9 10*3/uL (ref 3.4–10.8)

## 2023-11-03 LAB — GLUCOSE TOLERANCE, 2 HOURS W/ 1HR
Glucose, 1 hour: 149 mg/dL (ref 70–179)
Glucose, 2 hour: 115 mg/dL (ref 70–152)
Glucose, Fasting: 78 mg/dL (ref 70–91)

## 2023-11-03 LAB — RPR: RPR Ser Ql: NONREACTIVE

## 2023-11-03 LAB — HIV ANTIBODY (ROUTINE TESTING W REFLEX): HIV Screen 4th Generation wRfx: NONREACTIVE

## 2023-11-16 ENCOUNTER — Ambulatory Visit: Payer: Self-pay | Admitting: Certified Nurse Midwife

## 2023-11-16 ENCOUNTER — Other Ambulatory Visit: Payer: Self-pay

## 2023-11-16 VITALS — BP 116/65 | HR 79 | Wt 206.7 lb

## 2023-11-16 DIAGNOSIS — Z3A29 29 weeks gestation of pregnancy: Secondary | ICD-10-CM | POA: Diagnosis not present

## 2023-11-16 DIAGNOSIS — Z23 Encounter for immunization: Secondary | ICD-10-CM

## 2023-11-16 DIAGNOSIS — O219 Vomiting of pregnancy, unspecified: Secondary | ICD-10-CM

## 2023-11-16 DIAGNOSIS — Z3493 Encounter for supervision of normal pregnancy, unspecified, third trimester: Secondary | ICD-10-CM

## 2023-11-16 MED ORDER — ONDANSETRON 4 MG PO TBDP: 4.0000 mg | ORAL_TABLET | Freq: Three times a day (TID) | ORAL | 0 refills | Status: DC | PRN

## 2023-11-16 NOTE — Progress Notes (Signed)
   PRENATAL VISIT NOTE  Subjective:  Valerie Bates is a 28 y.o. G1P0000 at [redacted]w[redacted]d being seen today for ongoing prenatal care.  She is currently monitored for the following issues for this low-risk pregnancy and has Anxiety; Factor V Leiden mutation (HCC); Supervision of low-risk pregnancy; Depression; and Inactive tuberculosis on their problem list.  Patient reports nausea.  Contractions: Not present. Vag. Bleeding: None.  Movement: Present. Denies leaking of fluid.   The following portions of the patient's history were reviewed and updated as appropriate: allergies, current medications, past family history, past medical history, past social history, past surgical history and problem list.   Objective:   Vitals:   11/16/23 1318  BP: 116/65  Pulse: 79  Weight: 206 lb 11.2 oz (93.8 kg)    Fetal Status: Fetal Heart Rate (bpm): 140 Fundal Height: 28 cm Movement: Present     General:  Alert, oriented and cooperative. Patient is in no acute distress.  Skin: Skin is warm and dry. No rash noted.   Cardiovascular: Normal heart rate noted  Respiratory: Normal respiratory effort, no problems with respiration noted  Abdomen: Soft, gravid, appropriate for gestational age.  Pain/Pressure: Absent     Pelvic: Cervical exam deferred        Extremities: Normal range of motion.  Edema: None  Mental Status: Normal mood and affect. Normal behavior. Normal judgment and thought content.   Assessment and Plan:  Pregnancy: G1P0000 at [redacted]w[redacted]d 1. Encounter for supervision of low-risk pregnancy in third trimester (Primary) - Doing well, feeling regular and vigorous fetal movement   2. [redacted] weeks gestation of pregnancy - Routine OB care   3. Nausea and vomiting during pregnancy - Could be related to reflux, encouraged to try omeprazole at night. Will get OTC. - ondansetron (ZOFRAN-ODT) 4 MG disintegrating tablet; Take 1 tablet (4 mg total) by mouth every 8 (eight) hours as needed for nausea or vomiting.   Dispense: 15 tablet; Refill: 0  Preterm labor symptoms and general obstetric precautions including but not limited to vaginal bleeding, contractions, leaking of fluid and fetal movement were reviewed in detail with the patient. Please refer to After Visit Summary for other counseling recommendations.   Return in about 2 weeks (around 11/30/2023) for IN-PERSON, LOB.  Future Appointments  Date Time Provider Department Center  11/30/2023  1:55 PM Osborne Oman [facility]  12/14/2023  1:55 PM Osborne Oman [facility]  12/28/2023  1:55 PM Osborne Oman [facility]  01/04/2024  1:55 PM Osborne O[facility]  01/11/2024  1:55 PM Osborne Oman [facility]  01/18/2024  1:55 PM Bernerd Limbo, CNM [facility]    Bernerd Limbo, CNM

## 2023-11-24 ENCOUNTER — Ambulatory Visit: Payer: BC Managed Care – PPO | Admitting: Physical Therapy

## 2023-11-29 NOTE — Progress Notes (Unsigned)
   PRENATAL VISIT NOTE  Subjective:  Valerie Bates is a 28 y.o. G1P0000 at [redacted]w[redacted]d being seen today for ongoing prenatal care.  She is currently monitored for the following issues for this {Blank single:19197::"high-risk","low-risk"} pregnancy and has Anxiety; Factor V Leiden mutation (HCC); Supervision of low-risk pregnancy; Depression; and Inactive tuberculosis on their problem list.  Patient reports {sx:14538}.   .  .   . Denies leaking of fluid.   The following portions of the patient's history were reviewed and updated as appropriate: allergies, current medications, past family history, past medical history, past social history, past surgical history and problem list.   Objective:  There were no vitals filed for this visit.  Fetal Status:           General:  Alert, oriented and cooperative. Patient is in no acute distress.  Skin: Skin is warm and dry. No rash noted.   Cardiovascular: Normal heart rate noted  Respiratory: Normal respiratory effort, no problems with respiration noted  Abdomen: Soft, gravid, appropriate for gestational age.        Pelvic: {Blank single:19197::"Cervical exam performed in the presence of a chaperone","Cervical exam deferred"}        Extremities: Normal range of motion.     Mental Status: Normal mood and affect. Normal behavior. Normal judgment and thought content.   Assessment and Plan:  Pregnancy: G1P0000 at [redacted]w[redacted]d 1. Encounter for supervision of low-risk pregnancy in third trimester (Primary) ***  2. [redacted] weeks gestation of pregnancy ***  {Blank single:19197::"Term","Preterm"} labor symptoms and general obstetric precautions including but not limited to vaginal bleeding, contractions, leaking of fluid and fetal movement were reviewed in detail with the patient. Please refer to After Visit Summary for other counseling recommendations.   No follow-ups on file.  Future Appointments  Date Time Provider Department Center  11/30/2023  1:55 PM  Osborne Oman [facility]  12/14/2023  1:55 PM Osborne O[facility]  12/28/2023  1:55 PM Osborne Oman [facility]  01/04/2024  1:55 PM Osborne Oman [facility]  01/11/2024  1:55 PM Osborne Oman [facility]  01/18/2024  1:55 PM Bernerd Limbo, CNM [facility]    Bernerd Limbo, CNM

## 2023-11-30 ENCOUNTER — Ambulatory Visit: Payer: Self-pay | Admitting: Certified Nurse Midwife

## 2023-11-30 ENCOUNTER — Encounter: Payer: BC Managed Care – PPO | Admitting: Physical Therapy

## 2023-11-30 ENCOUNTER — Other Ambulatory Visit: Payer: Self-pay

## 2023-11-30 VITALS — BP 111/73 | HR 95 | Wt 210.7 lb

## 2023-11-30 DIAGNOSIS — Z3A31 31 weeks gestation of pregnancy: Secondary | ICD-10-CM | POA: Diagnosis not present

## 2023-11-30 DIAGNOSIS — Z3493 Encounter for supervision of normal pregnancy, unspecified, third trimester: Secondary | ICD-10-CM | POA: Diagnosis not present

## 2023-12-05 ENCOUNTER — Encounter: Payer: Self-pay | Admitting: Certified Nurse Midwife

## 2023-12-06 ENCOUNTER — Encounter: Payer: Self-pay | Admitting: Certified Nurse Midwife

## 2023-12-06 DIAGNOSIS — Z22322 Carrier or suspected carrier of Methicillin resistant Staphylococcus aureus: Secondary | ICD-10-CM | POA: Insufficient documentation

## 2023-12-07 ENCOUNTER — Encounter: Payer: BC Managed Care – PPO | Admitting: Physical Therapy

## 2023-12-13 NOTE — Progress Notes (Unsigned)
   PRENATAL VISIT NOTE  Subjective:  Valerie Bates is a 28 y.o. G1P0000 at [redacted]w[redacted]d being seen today for ongoing prenatal care.  She is currently monitored for the following issues for this {Blank single:19197::"high-risk","low-risk"} pregnancy and has Anxiety; Factor V Leiden mutation (HCC); Supervision of low-risk pregnancy; Depression; Inactive tuberculosis; and MRSA (methicillin resistant staph aureus) culture positive on their problem list.  Patient reports {sx:14538}.   .  .   . Denies leaking of fluid.   The following portions of the patient's history were reviewed and updated as appropriate: allergies, current medications, past family history, past medical history, past social history, past surgical history and problem list.   Objective:  There were no vitals filed for this visit.  Fetal Status:           General:  Alert, oriented and cooperative. Patient is in no acute distress.  Skin: Skin is warm and dry. No rash noted.   Cardiovascular: Normal heart rate noted  Respiratory: Normal respiratory effort, no problems with respiration noted  Abdomen: Soft, gravid, appropriate for gestational age.        Pelvic: {Blank single:19197::"Cervical exam performed in the presence of a chaperone","Cervical exam deferred"}        Extremities: Normal range of motion.     Mental Status: Normal mood and affect. Normal behavior. Normal judgment and thought content.   Assessment and Plan:  Pregnancy: G1P0000 at [redacted]w[redacted]d 1. Encounter for supervision of low-risk pregnancy in third trimester (Primary) ***  2. [redacted] weeks gestation of pregnancy ***  3. MRSA (methicillin resistant staph aureus) culture positive ***  {Blank single:19197::"Term","Preterm"} labor symptoms and general obstetric precautions including but not limited to vaginal bleeding, contractions, leaking of fluid and fetal movement were reviewed in detail with the patient. Please refer to After Visit Summary for other counseling  recommendations.   No follow-ups on file.  Future Appointments  Date Time Provider Department Center  12/14/2023  1:55 PM Cleophas Dadds [facility]  12/28/2023  2:35 PM Cleophas Dadds [facility]  01/03/2024  1:55 PM Cleophas Dadds [facility]  01/11/2024  1:55 PM Cleophas Dadds [facility]  01/18/2024  2:35 PM Derick Fleeting, CNM [facility]    Derick Fleeting, CNM

## 2023-12-14 ENCOUNTER — Ambulatory Visit: Payer: Self-pay | Admitting: Certified Nurse Midwife

## 2023-12-14 ENCOUNTER — Other Ambulatory Visit: Payer: Self-pay

## 2023-12-14 ENCOUNTER — Encounter: Payer: BC Managed Care – PPO | Admitting: Physical Therapy

## 2023-12-14 VITALS — BP 106/71 | HR 85 | Wt 206.2 lb

## 2023-12-14 DIAGNOSIS — Z3A33 33 weeks gestation of pregnancy: Secondary | ICD-10-CM | POA: Diagnosis not present

## 2023-12-14 DIAGNOSIS — O219 Vomiting of pregnancy, unspecified: Secondary | ICD-10-CM | POA: Diagnosis not present

## 2023-12-14 DIAGNOSIS — Z22322 Carrier or suspected carrier of Methicillin resistant Staphylococcus aureus: Secondary | ICD-10-CM

## 2023-12-14 DIAGNOSIS — Z3493 Encounter for supervision of normal pregnancy, unspecified, third trimester: Secondary | ICD-10-CM

## 2023-12-14 MED ORDER — SCOPOLAMINE 1 MG/3DAYS TD PT72: 1.0000 | MEDICATED_PATCH | TRANSDERMAL | 6 refills | Status: DC

## 2023-12-21 ENCOUNTER — Encounter: Payer: BC Managed Care – PPO | Admitting: Physical Therapy

## 2023-12-28 ENCOUNTER — Ambulatory Visit: Payer: Self-pay | Admitting: Certified Nurse Midwife

## 2023-12-28 ENCOUNTER — Encounter: Payer: BC Managed Care – PPO | Admitting: Physical Therapy

## 2023-12-28 ENCOUNTER — Other Ambulatory Visit: Payer: Self-pay

## 2023-12-28 VITALS — BP 122/82 | HR 81 | Wt 209.6 lb

## 2023-12-28 DIAGNOSIS — Z3A35 35 weeks gestation of pregnancy: Secondary | ICD-10-CM

## 2023-12-28 DIAGNOSIS — Z3493 Encounter for supervision of normal pregnancy, unspecified, third trimester: Secondary | ICD-10-CM

## 2023-12-28 DIAGNOSIS — O219 Vomiting of pregnancy, unspecified: Secondary | ICD-10-CM

## 2023-12-28 NOTE — Progress Notes (Signed)
   PRENATAL VISIT NOTE  Subjective:  Valerie Bates is a 28 y.o. G1P0000 at [redacted]w[redacted]d being seen today for ongoing prenatal care.  She is currently monitored for the following issues for this low-risk pregnancy and has Anxiety; Factor V Leiden mutation (HCC); Supervision of low-risk pregnancy; Depression; Inactive tuberculosis; and MRSA (methicillin resistant staph aureus) culture positive on their problem list.  Patient reports no complaints.  Contractions: Irritability. Vag. Bleeding: None.  Movement: Present. Denies leaking of fluid.   The following portions of the patient's history were reviewed and updated as appropriate: allergies, current medications, past family history, past medical history, past social history, past surgical history and problem list.   Objective:   Vitals:   12/28/23 1427  BP: 122/82  Pulse: 81  Weight: 209 lb 9.6 oz (95.1 kg)    Fetal Status: Fetal Heart Rate (bpm): 135 Fundal Height: 35 cm Movement: Present  Presentation: Vertex  General:  Alert, oriented and cooperative. Patient is in no acute distress.  Skin: Skin is warm and dry. No rash noted.   Cardiovascular: Normal heart rate noted  Respiratory: Normal respiratory effort, no problems with respiration noted  Abdomen: Soft, gravid, appropriate for gestational age.  Pain/Pressure: Present     Pelvic: Cervical exam deferred        Extremities: Normal range of motion.  Edema: Moderate pitting, indentation subsides rapidly  Mental Status: Normal mood and affect. Normal behavior. Normal judgment and thought content.   Assessment and Plan:  Pregnancy: G1P0000 at [redacted]w[redacted]d 1. Encounter for supervision of low-risk pregnancy in third trimester (Primary) - Doing well, feeling regular and vigorous fetal movement   2. [redacted] weeks gestation of pregnancy - Routine OB care   3. Nausea and vomiting during pregnancy - Feeling much better, used the scope patches twice but has done well for the past week without  one.  Preterm labor symptoms and general obstetric precautions including but not limited to vaginal bleeding, contractions, leaking of fluid and fetal movement were reviewed in detail with the patient. Please refer to After Visit Summary for other counseling recommendations.   Return in about 1 week (around 01/04/2024) for IN-PERSON, LOB.  Future Appointments  Date Time Provider Department Center  01/03/2024  1:55 PM Cleophas Dadds [facility]  01/11/2024  1:55 PM Cleophas Dadds [facility]  01/18/2024  2:35 PM Derick Fleeting, CNM [facility]   Derick Fleeting, CNM

## 2024-01-03 ENCOUNTER — Ambulatory Visit: Admitting: Certified Nurse Midwife

## 2024-01-03 ENCOUNTER — Other Ambulatory Visit (HOSPITAL_COMMUNITY)
Admission: RE | Admit: 2024-01-03 | Discharge: 2024-01-03 | Disposition: A | Discharge status: Home / Self Care | Source: Ambulatory Visit | Attending: Certified Nurse Midwife | Admitting: Certified Nurse Midwife

## 2024-01-03 ENCOUNTER — Other Ambulatory Visit: Payer: Self-pay

## 2024-01-03 VITALS — BP 124/79 | HR 84 | Wt 208.2 lb

## 2024-01-03 DIAGNOSIS — Z3490 Encounter for supervision of normal pregnancy, unspecified, unspecified trimester: Secondary | ICD-10-CM | POA: Insufficient documentation

## 2024-01-03 DIAGNOSIS — Z3493 Encounter for supervision of normal pregnancy, unspecified, third trimester: Secondary | ICD-10-CM | POA: Diagnosis not present

## 2024-01-03 DIAGNOSIS — Z3A36 36 weeks gestation of pregnancy: Secondary | ICD-10-CM | POA: Diagnosis not present

## 2024-01-03 NOTE — Progress Notes (Signed)
   PRENATAL VISIT NOTE  Subjective:  Valerie Bates is a 28 y.o. G1P0000 at [redacted]w[redacted]d being seen today for ongoing prenatal care.  She is currently monitored for the following issues for this low-risk pregnancy and has Anxiety; Factor V Leiden mutation (HCC); Supervision of low-risk pregnancy; Depression; Inactive tuberculosis; and MRSA (methicillin resistant staph aureus) culture positive on their problem list.  Patient reports no complaints today but did have a bout of food poisoning yesterday. Vomiting 5x but then it eased up, has been able to hold down food/fluids since last night.  Contractions: Irritability. Vag. Bleeding: None.  Movement: Present. Denies leaking of fluid.   The following portions of the patient's history were reviewed and updated as appropriate: allergies, current medications, past family history, past medical history, past social history, past surgical history and problem list.   Objective:   Vitals:   01/03/24 1355  BP: 124/79  Pulse: 84  Weight: 208 lb 3.2 oz (94.4 kg)    Fetal Status: Fetal Heart Rate (bpm): 135 Fundal Height: 36 cm Movement: Present  Presentation: Vertex  General:              Alert, oriented and cooperative. Patient is in no acute distress.  Skin: Skin is warm and dry. No rash noted.   Cardiovascular: Normal heart rate noted  Respiratory: Normal respiratory effort, no problems with respiration noted  Abdomen: Soft, gravid, appropriate for gestational age.  Pain/Pressure: Absent     Pelvic: Cervical exam deferred        Extremities: Normal range of motion.  Edema: Trace  Mental Status: Normal mood and affect. Normal behavior. Normal judgment and thought content.   Assessment and Plan:  Pregnancy: G1P0000 at [redacted]w[redacted]d 1. Encounter for supervision of low-risk pregnancy in third trimester (Primary) - Doing well, feeling regular and vigorous fetal movement  - Culture, beta strep (group b only) - Cervicovaginal ancillary only( [facility])  2. [redacted] weeks gestation of pregnancy - Routine OB care   Preterm labor symptoms and general obstetric precautions including but not limited to vaginal bleeding, contractions, leaking of fluid and fetal movement were reviewed in detail with the patient. Please refer to After Visit Summary for other counseling recommendations.   Return in about 1 week (around 01/10/2024) for IN-PERSON, HOB.  Future Appointments  Date Time Provider Department Center  01/11/2024  1:55 PM Cleophas Dadds [facility]  01/18/2024  2:35 PM Derick Fleeting, CNM [facility]   Derick Fleeting, CNM

## 2024-01-04 ENCOUNTER — Encounter: Payer: Self-pay | Admitting: Certified Nurse Midwife

## 2024-01-04 ENCOUNTER — Encounter: Payer: BC Managed Care – PPO | Admitting: Physical Therapy

## 2024-01-05 LAB — CERVICOVAGINAL ANCILLARY ONLY
Chlamydia: NEGATIVE
Comment: NEGATIVE
Comment: NORMAL
Neisseria Gonorrhea: NEGATIVE

## 2024-01-06 LAB — CULTURE, BETA STREP (GROUP B ONLY): Strep Gp B Culture: NEGATIVE

## 2024-01-11 ENCOUNTER — Other Ambulatory Visit: Payer: Self-pay

## 2024-01-11 ENCOUNTER — Ambulatory Visit: Payer: Self-pay | Admitting: Certified Nurse Midwife

## 2024-01-11 VITALS — BP 129/83 | HR 103 | Wt 210.5 lb

## 2024-01-11 DIAGNOSIS — O0993 Supervision of high risk pregnancy, unspecified, third trimester: Secondary | ICD-10-CM | POA: Diagnosis not present

## 2024-01-11 DIAGNOSIS — Z3A37 37 weeks gestation of pregnancy: Secondary | ICD-10-CM | POA: Diagnosis not present

## 2024-01-14 NOTE — Progress Notes (Signed)
   PRENATAL VISIT NOTE  Subjective:  Valerie Bates is a 28 y.o. G1P0000 at [redacted]w[redacted]d being seen today for ongoing prenatal care.  She is currently monitored for the following issues for this high-risk pregnancy and has Anxiety; Factor V Leiden mutation (HCC); Supervision of low-risk pregnancy; Depression; Inactive tuberculosis; and MRSA (methicillin resistant staph aureus) culture positive on their problem list.  Patient reports no complaints.  Contractions: Not present. Vag. Bleeding: None.  Movement: Present. Denies leaking of fluid.   The following portions of the patient's history were reviewed and updated as appropriate: allergies, current medications, past family history, past medical history, past social history, past surgical history and problem list.   Objective:    Vitals:   01/11/24 1334  BP: 129/83  Pulse: (!) 103  Weight: 210 lb 8 oz (95.5 kg)   Fetal Status:  Fetal Heart Rate (bpm): 146 Fundal Height: 37 cm Movement: Present    General: Alert, oriented and cooperative. Patient is in no acute distress.  Skin: Skin is warm and dry. No rash noted.   Cardiovascular: Normal heart rate noted  Respiratory: Normal respiratory effort, no problems with respiration noted  Abdomen: Soft, gravid, appropriate for gestational age.  Pain/Pressure: Absent     Pelvic: Cervical exam deferred        Extremities: Normal range of motion.  Edema: None  Mental Status: Normal mood and affect. Normal behavior. Normal judgment and thought content.   Assessment and Plan:  Pregnancy: G1P0000 at [redacted]w[redacted]d 1. Supervision of high risk pregnancy in third trimester (Primary) - Doing well, feeling regular and vigorous fetal movement   2. [redacted] weeks gestation of pregnancy - Routine OB care, looking forward to delivery   Term labor symptoms and general obstetric precautions including but not limited to vaginal bleeding, contractions, leaking of fluid and fetal movement were reviewed in detail with the  patient. Please refer to After Visit Summary for other counseling recommendations.   No follow-ups on file.  Future Appointments  Date Time Provider Department Center  01/18/2024  2:35 PM Cleophas Dadds [facility]   Derick Fleeting, CNM

## 2024-01-18 ENCOUNTER — Ambulatory Visit: Payer: Self-pay | Admitting: Certified Nurse Midwife

## 2024-01-18 ENCOUNTER — Other Ambulatory Visit: Payer: Self-pay

## 2024-01-18 VITALS — BP 116/76 | HR 79 | Wt 212.1 lb

## 2024-01-18 DIAGNOSIS — Z3403 Encounter for supervision of normal first pregnancy, third trimester: Secondary | ICD-10-CM | POA: Diagnosis not present

## 2024-01-18 DIAGNOSIS — Z3493 Encounter for supervision of normal pregnancy, unspecified, third trimester: Secondary | ICD-10-CM

## 2024-01-18 DIAGNOSIS — Z3A38 38 weeks gestation of pregnancy: Secondary | ICD-10-CM | POA: Diagnosis not present

## 2024-01-25 ENCOUNTER — Other Ambulatory Visit: Payer: Self-pay

## 2024-01-25 ENCOUNTER — Ambulatory Visit: Admitting: Certified Nurse Midwife

## 2024-01-25 ENCOUNTER — Encounter: Payer: BC Managed Care – PPO | Admitting: Physical Therapy

## 2024-01-25 VITALS — BP 121/78 | HR 83 | Wt 212.8 lb

## 2024-01-25 DIAGNOSIS — Z3493 Encounter for supervision of normal pregnancy, unspecified, third trimester: Secondary | ICD-10-CM

## 2024-01-25 DIAGNOSIS — Z3483 Encounter for supervision of other normal pregnancy, third trimester: Secondary | ICD-10-CM

## 2024-01-25 DIAGNOSIS — Z3A39 39 weeks gestation of pregnancy: Secondary | ICD-10-CM

## 2024-01-25 NOTE — Progress Notes (Signed)
   PRENATAL VISIT NOTE  Subjective:  Valerie Bates is a 28 y.o. G1P0000 at [redacted]w[redacted]d being seen today for ongoing prenatal care.  She is currently monitored for the following issues for this low-risk pregnancy and has Anxiety; Factor V Leiden mutation (HCC); Supervision of low-risk pregnancy; Depression; and Inactive tuberculosis on their problem list.  Patient reports no complaints.  Contractions: Irregular. Vag. Bleeding: None.  Movement: Present. Denies leaking of fluid.   The following portions of the patient's history were reviewed and updated as appropriate: allergies, current medications, past family history, past medical history, past social history, past surgical history and problem list.   Objective:    Vitals:   01/25/24 1454  BP: 121/78  Pulse: 83  Weight: 212 lb 12.8 oz (96.5 kg)    Fetal Status:  Fetal Heart Rate (bpm): 145 Fundal Height: 39 cm Movement: Present Presentation: Vertex  General: Alert, oriented and cooperative. Patient is in no acute distress.  Skin: Skin is warm and dry. No rash noted.   Cardiovascular: Normal heart rate noted  Respiratory: Normal respiratory effort, no problems with respiration noted  Abdomen: Soft, gravid, appropriate for gestational age.  Pain/Pressure: Absent     Pelvic: Cervical exam performed in the presence of a chaperone Dilation: Fingertip Effacement (%): 60 Station: -2  Extremities: Normal range of motion.  Edema: Mild pitting, slight indentation  Mental Status: Normal mood and affect. Normal behavior. Normal judgment and thought content.   Assessment and Plan:  Pregnancy: G1P0000 at [redacted]w[redacted]d 1. Encounter for supervision of low-risk pregnancy in third trimester (Primary) - Doing well, feeling regular and vigorous fetal movement  2. [redacted] weeks gestation of pregnancy - Routine OB care, post-dates IOL scheduled and orders placed - CBC; Standing - Type and screen; Standing - RPR; Standing  Term labor symptoms and general  obstetric precautions including but not limited to vaginal bleeding, contractions, leaking of fluid and fetal movement were reviewed in detail with the patient. Please refer to After Visit Summary for other counseling recommendations.   Return in about 1 week (around 02/01/2024) for IN-PERSON, LOB.  Future Appointments  Date Time Provider Department Center  02/01/2024  2:35 PM Cleophas Dadds [facility]  02/02/2024 12:00 AM [facility] SCHED ROOM [facility] None   Derick Fleeting, CNM

## 2024-01-26 ENCOUNTER — Telehealth: Payer: Self-pay | Admitting: *Deleted

## 2024-01-26 NOTE — Progress Notes (Signed)
   PRENATAL VISIT NOTE  Subjective:  Valerie Bates is a 28 y.o. G1P0000 at [redacted]w[redacted]d being seen today for ongoing prenatal care.  She is currently monitored for the following issues for this low-risk pregnancy and has Anxiety; Factor V Leiden mutation (HCC); Supervision of low-risk pregnancy; Depression; and Inactive tuberculosis on their problem list.  Patient reports no complaints.  Contractions: Irregular. Vag. Bleeding: None.  Movement: Present. Denies leaking of fluid.   The following portions of the patient's history were reviewed and updated as appropriate: allergies, current medications, past family history, past medical history, past social history, past surgical history and problem list.   Objective:    Vitals:   01/18/24 1432  BP: 116/76  Pulse: 79  Weight: 212 lb 1.6 oz (96.2 kg)    Fetal Status:  Fetal Heart Rate (bpm): 142 Fundal Height: 38 cm Movement: Present Presentation: Vertex  General: Alert, oriented and cooperative. Patient is in no acute distress.  Skin: Skin is warm and dry. No rash noted.   Cardiovascular: Normal heart rate noted  Respiratory: Normal respiratory effort, no problems with respiration noted  Abdomen: Soft, gravid, appropriate for gestational age.  Pain/Pressure: Present (lower abdomen and pelvic area)     Pelvic: Cervical exam deferred        Extremities: Normal range of motion.  Edema: Trace (bilateral hands and feet)  Mental Status: Normal mood and affect. Normal behavior. Normal judgment and thought content.   Assessment and Plan:  Pregnancy: G1P0000 at [redacted]w[redacted]d 1. Encounter for supervision of low-risk pregnancy in third trimester (Primary) - Doing well, feeling regular and vigorous fetal movement   2. [redacted] weeks gestation of pregnancy - Routine OB care, wants to discuss IOL timing at next visit  Term labor symptoms and general obstetric precautions including but not limited to vaginal bleeding, contractions, leaking of fluid and fetal  movement were reviewed in detail with the patient. Please refer to After Visit Summary for other counseling recommendations.   Return in about 1 week (around 01/25/2024) for IN-PERSON.  Derick Fleeting, CNM

## 2024-01-26 NOTE — Telephone Encounter (Signed)
 Received a voice message that was very difficult to understand due to a female speaking with a thick accent. I understood requesting records and requesting a call. I called and verified were calling to see if we received records request they sent. I explained those requests go a different part and I will send a message to that person. I did verify our fax number and provider is part of our office. Will route to staff who handles records requests. Alejandra Hurst

## 2024-01-27 ENCOUNTER — Encounter (HOSPITAL_COMMUNITY): Payer: Self-pay

## 2024-01-27 ENCOUNTER — Telehealth (HOSPITAL_COMMUNITY): Payer: Self-pay | Admitting: *Deleted

## 2024-01-27 NOTE — Telephone Encounter (Signed)
 Preadmission screen

## 2024-02-01 ENCOUNTER — Encounter: Payer: BC Managed Care – PPO | Admitting: Physical Therapy

## 2024-02-01 ENCOUNTER — Inpatient Hospital Stay (HOSPITAL_COMMUNITY)
Admission: AD | Admit: 2024-02-01 | Discharge: 2024-02-04 | DRG: 786 | Disposition: A | Discharge status: Home / Self Care | Attending: Obstetrics and Gynecology | Admitting: Obstetrics and Gynecology

## 2024-02-01 ENCOUNTER — Other Ambulatory Visit: Payer: Self-pay

## 2024-02-01 ENCOUNTER — Telehealth (HOSPITAL_COMMUNITY): Payer: Self-pay | Admitting: *Deleted

## 2024-02-01 ENCOUNTER — Encounter (HOSPITAL_COMMUNITY): Payer: Self-pay | Admitting: *Deleted

## 2024-02-01 ENCOUNTER — Ambulatory Visit: Admitting: Certified Nurse Midwife

## 2024-02-01 VITALS — BP 120/81 | HR 81 | Wt 209.4 lb

## 2024-02-01 DIAGNOSIS — Z3493 Encounter for supervision of normal pregnancy, unspecified, third trimester: Secondary | ICD-10-CM

## 2024-02-01 DIAGNOSIS — F419 Anxiety disorder, unspecified: Secondary | ICD-10-CM | POA: Diagnosis present

## 2024-02-01 DIAGNOSIS — Z3A40 40 weeks gestation of pregnancy: Secondary | ICD-10-CM

## 2024-02-01 DIAGNOSIS — O480 Post-term pregnancy: Secondary | ICD-10-CM

## 2024-02-01 DIAGNOSIS — Z98891 History of uterine scar from previous surgery: Secondary | ICD-10-CM

## 2024-02-01 DIAGNOSIS — Z8249 Family history of ischemic heart disease and other diseases of the circulatory system: Secondary | ICD-10-CM | POA: Diagnosis not present

## 2024-02-01 DIAGNOSIS — O99344 Other mental disorders complicating childbirth: Secondary | ICD-10-CM | POA: Diagnosis present

## 2024-02-01 DIAGNOSIS — O4593 Premature separation of placenta, unspecified, third trimester: Secondary | ICD-10-CM | POA: Diagnosis present

## 2024-02-01 DIAGNOSIS — Z3490 Encounter for supervision of normal pregnancy, unspecified, unspecified trimester: Secondary | ICD-10-CM

## 2024-02-01 DIAGNOSIS — F32A Depression, unspecified: Secondary | ICD-10-CM | POA: Diagnosis present

## 2024-02-01 DIAGNOSIS — D6851 Activated protein C resistance: Secondary | ICD-10-CM | POA: Diagnosis present

## 2024-02-01 LAB — CBC
HCT: 37.7 % (ref 36.0–46.0)
Hemoglobin: 13.1 g/dL (ref 12.0–15.0)
MCH: 30.5 pg (ref 26.0–34.0)
MCHC: 34.7 g/dL (ref 30.0–36.0)
MCV: 87.7 fL (ref 80.0–100.0)
Platelets: 173 10*3/uL (ref 150–400)
RBC: 4.3 MIL/uL (ref 3.87–5.11)
RDW: 13.2 % (ref 11.5–15.5)
WBC: 10.2 10*3/uL (ref 4.0–10.5)
nRBC: 0 % (ref 0.0–0.2)

## 2024-02-01 LAB — TYPE AND SCREEN
ABO/RH(D): A POS
Antibody Screen: NEGATIVE

## 2024-02-01 MED ORDER — LACTATED RINGERS IV SOLN: INTRAVENOUS | Status: DC

## 2024-02-01 MED ORDER — SERTRALINE HCL 50 MG PO TABS
50.0000 mg | ORAL_TABLET | Freq: Every day | ORAL | Status: DC
  Administered 2024-02-01: 50 mg via ORAL
  Filled 2024-02-01: qty 1

## 2024-02-01 MED ORDER — SODIUM CHLORIDE 0.9 % IV SOLN: 250.0000 mL | INTRAVENOUS | Status: DC | PRN

## 2024-02-01 MED ORDER — ONDANSETRON HCL 4 MG/2ML IJ SOLN: 4.0000 mg | Freq: Four times a day (QID) | INTRAMUSCULAR | Status: DC | PRN

## 2024-02-01 MED ORDER — OXYTOCIN BOLUS FROM INFUSION: 333.0000 mL | Freq: Once | INTRAVENOUS | Status: DC

## 2024-02-01 MED ORDER — SODIUM CHLORIDE 0.9% FLUSH: 3.0000 mL | INTRAVENOUS | Status: DC | PRN

## 2024-02-01 MED ORDER — SODIUM CHLORIDE 0.9% FLUSH: 3.0000 mL | Freq: Two times a day (BID) | INTRAVENOUS | Status: DC

## 2024-02-01 MED ORDER — MISOPROSTOL 25 MCG QUARTER TABLET
25.0000 ug | ORAL_TABLET | Freq: Once | ORAL | Status: AC
  Administered 2024-02-01: 25 ug via ORAL
  Filled 2024-02-01: qty 1

## 2024-02-01 MED ORDER — LIDOCAINE HCL (PF) 1 % IJ SOLN: 30.0000 mL | INTRAMUSCULAR | Status: DC | PRN

## 2024-02-01 MED ORDER — TERBUTALINE SULFATE 1 MG/ML IJ SOLN
0.2500 mg | Freq: Once | INTRAMUSCULAR | Status: DC | PRN
  Filled 2024-02-01: qty 1

## 2024-02-01 MED ORDER — FENTANYL CITRATE (PF) 100 MCG/2ML IJ SOLN
50.0000 ug | INTRAMUSCULAR | Status: DC | PRN
  Administered 2024-02-01: 100 ug via INTRAVENOUS
  Filled 2024-02-01 (×2): qty 2

## 2024-02-01 MED ORDER — OXYCODONE-ACETAMINOPHEN 5-325 MG PO TABS: 2.0000 | ORAL_TABLET | ORAL | Status: DC | PRN

## 2024-02-01 MED ORDER — SOD CITRATE-CITRIC ACID 500-334 MG/5ML PO SOLN: 30.0000 mL | ORAL | Status: DC | PRN

## 2024-02-01 MED ORDER — OXYCODONE-ACETAMINOPHEN 5-325 MG PO TABS: 1.0000 | ORAL_TABLET | ORAL | Status: DC | PRN

## 2024-02-01 MED ORDER — LACTATED RINGERS IV SOLN
500.0000 mL | INTRAVENOUS | Status: DC | PRN
  Administered 2024-02-01: 1000 mL via INTRAVENOUS

## 2024-02-01 MED ORDER — ACETAMINOPHEN 325 MG PO TABS: 650.0000 mg | ORAL_TABLET | ORAL | Status: DC | PRN

## 2024-02-01 MED ORDER — MISOPROSTOL 25 MCG QUARTER TABLET
25.0000 ug | ORAL_TABLET | Freq: Once | ORAL | Status: AC
  Administered 2024-02-01: 25 ug via VAGINAL
  Filled 2024-02-01: qty 1

## 2024-02-01 MED ORDER — FLEET ENEMA RE ENEM: 1.0000 | ENEMA | RECTAL | Status: DC | PRN

## 2024-02-01 MED ORDER — OXYTOCIN-SODIUM CHLORIDE 30-0.9 UT/500ML-% IV SOLN: 2.5000 [IU]/h | INTRAVENOUS | Status: DC

## 2024-02-01 NOTE — H&P (Addendum)
 Valerie Bates is a 28 y.o. G1P0000 female at [redacted]w[redacted]d by LMP c/w [redacted]w[redacted]d u/s presenting for IOL due to postdates; she had an outpatient foley placed today in the office.   Reports active fetal movement, contractions: regular, every 2-3 minutes, vaginal bleeding: scant staining, membranes: intact.  Initiated prenatal care at [facility] at 11.5 wks.   Most recent u/s: [redacted]w[redacted]d, EFW 50%, AFI nl, ant placenta.   This pregnancy complicated by: # Factor V Leiden # dep/anxiety   Prenatal History/Complications:  # primigravida  Past Medical History: Past Medical History:  Diagnosis Date   Allergic rhinitis 06/21/2022   Allergy    Anxiety    Depression    Factor 5 Leiden mutation, heterozygous (HCC)    Migraines     Past Surgical History: Past Surgical History:  Procedure Laterality Date   WISDOM TOOTH EXTRACTION  2015    Obstetrical History: OB History     Gravida  1   Para  0   Term  0   Preterm  0   AB  0   Living  0      SAB  0   IAB  0   Ectopic  0   Multiple  0   Live Births  0           Social History: Social History   Socioeconomic History   Marital status: Married    Spouse name: Not on file   Number of children: Not on file   Years of education: Not on file   Highest education level: Master's degree (e.g., MA, MS, MEng, MEd, MSW, MBA)  Occupational History   Not on file  Tobacco Use   Smoking status: Never   Smokeless tobacco: Never  Vaping Use   Vaping status: Never Used  Substance and Sexual Activity   Alcohol use: Not Currently    Comment: socially   Drug use: Never   Sexual activity: Yes  Other Topics Concern   Not on file  Social History Narrative   Not on file   Social Drivers of Health   Financial Resource Strain: Low Risk  (09/14/2023)   Overall Financial Resource Strain (CARDIA)    Difficulty of Paying Living Expenses: Not hard at all  Food Insecurity: No Food Insecurity (09/14/2023)   Hunger Vital Sign    Worried About  Running Out of Food in the Last Year: Never true    Ran Out of Food in the Last Year: Never true  Transportation Needs: No Transportation Needs (09/14/2023)   PRAPARE - Administrator, Civil Service (Medical): No    Lack of Transportation (Non-Medical): No  Physical Activity: Sufficiently Active (09/14/2023)   Exercise Vital Sign    Days of Exercise per Week: 4 days    Minutes of Exercise per Session: 40 min  Stress: No Stress Concern Present (09/14/2023)   [employer] of Occupational Health - Occupational Stress Questionnaire    Feeling of Stress : Not at all  Social Connections: Moderately Integrated (09/14/2023)   Social Connection and Isolation Panel [NHANES]    Frequency of Communication with Friends and Family: More than three times a week    Frequency of Social Gatherings with Friends and Family: Once a week    Attends Religious Services: More than 4 times per year    Active Member of [employer] or Organizations: No    Attends Engineer, structural: Not on file    Marital Status: Married    Family  History: Family History  Problem Relation Age of Onset   Hearing loss Mother    Arthritis Mother    Hyperlipidemia Father    Factor V Leiden deficiency Father    Depression Sister    Anxiety disorder Sister    Migraines Brother    Breast cancer Maternal Aunt    Breast cancer Paternal Aunt    Depression Maternal Grandmother    COPD Maternal Grandmother    Cancer Maternal Grandmother    Ovarian cancer Maternal Grandmother    Cancer Maternal Grandfather    Cancer Paternal Grandmother    Breast cancer Paternal Grandmother    Heart disease Paternal Grandmother    Migraines Paternal Grandfather    Heart disease Paternal Grandfather     Allergies: No Known Allergies  Medications Prior to Admission  Medication Sig Dispense Refill Last Dose/Taking   aspirin EC (ASPIR-LOW) 81 MG tablet 81 mg once.      budesonide  (RHINOCORT  AQUA) 32 MCG/ACT nasal spray Place  2 sprays into both nostrils daily. 8.43 mL 11    Cetirizine HCl (ZYRTEC PO) Take by mouth daily.      ondansetron  (ZOFRAN -ODT) 4 MG disintegrating tablet Take 1 tablet (4 mg total) by mouth every 8 (eight) hours as needed for nausea or vomiting. (Patient not taking: Reported on 02/01/2024) 15 tablet 0    Prenatal Vit-Fe Fumarate-FA (MULTIVITAMIN-PRENATAL) 27-0.8 MG TABS tablet Take 1 tablet by mouth daily at 12 noon.      scopolamine  (TRANSDERM-SCOP) 1 MG/3DAYS Place 1 patch (1.5 mg total) onto the skin every 3 (three) days. (Patient not taking: Reported on 02/01/2024) 4 patch 6    sertraline  (ZOLOFT ) 100 MG tablet Take 0.5 tablets (50 mg total) by mouth daily. 90 tablet 0     Review of Systems  Pertinent pos/neg as indicated in HPI  Height 5' 7 (1.702 m), weight 97.9 kg, last menstrual period 04/21/2023. General appearance: alert and no distress Lungs: clear to auscultation bilaterally Heart: regular rate and rhythm Abdomen: gravid, soft, non-tender, EFW by Leopold's approximately 7-8lbs Extremities: tr edema  Fetal monitoring: FHR: 130-140s bpm, variability: moderate,  Accelerations: Present,  decelerations:  Absent Uterine activity: Frequency: Every 2-3 minutes   Presentation: cephalic   Prenatal labs: ABO, Rh: A/Positive/-- (11/20 1519) Antibody: Negative (11/20 1519) Rubella: 1.00 (11/20 1519) RPR: Non Reactive (03/12 0845)  HBsAg: Negative (11/20 1519)  HIV: Non Reactive (03/12 0845)  GBS: Negative/-- (05/13 1515)  2hr GTT: 78/149/115  Prenatal Transfer Tool  Maternal Diabetes: No Genetic Screening: Normal Maternal Ultrasounds/Referrals: Normal Fetal Ultrasounds or other Referrals:  None Maternal Substance Abuse:  No Significant Maternal Medications:  None Significant Maternal Lab Results: Group B Strep negative  No results found for this or any previous visit (from the past 24 hours).   Assessment:  [redacted]w[redacted]d SIUP  G1P0000  S/P cervical foley placed outpatient,  still in place  Cat 1 FHR  GBS Negative/-- (05/13 1515)  Plan:  Admit to L&D  IV pain meds/epidural prn active labor  Plan continued cervical ripening with dual cytotec dosing; Pitocin when foley comes out  Anticipate vag delivery (if C/S would be delivery mode, per MFM, will need Lovenox x 6wk PP)  Continue home Zoloft  50mg  q hs   Plans to breastfeed  Contraception: outpatient IUD   Jolayne Natter CNM 02/01/2024, 7:31 PM

## 2024-02-01 NOTE — Progress Notes (Signed)
   PRENATAL VISIT NOTE  Subjective:  Valerie Bates is a 28 y.o. G1P0000 at [redacted]w[redacted]d being seen today for ongoing prenatal care.  She is currently monitored for the following issues for this low-risk pregnancy and has Anxiety; Factor V Leiden mutation (HCC); Supervision of low-risk pregnancy; Depression; and Inactive tuberculosis on their problem list.  Patient reports no complaints.  Contractions: Irregular. Vag. Bleeding: Bloody Show.  Movement: Present. Denies leaking of fluid.   The following portions of the patient's history were reviewed and updated as appropriate: allergies, current medications, past family history, past medical history, past social history, past surgical history and problem list.   Objective:    Vitals:   02/01/24 1754  BP: 120/81  Pulse: 81  Weight: 209 lb 6.4 oz (95 kg)    Fetal Status:  Fetal Heart Rate (bpm): 148 Fundal Height: 37 cm Movement: Present Presentation: Vertex  General: Alert, oriented and cooperative. Patient is in no acute distress.  Skin: Skin is warm and dry. No rash noted.   Cardiovascular: Normal heart rate noted  Respiratory: Normal respiratory effort, no problems with respiration noted  Abdomen: Soft, gravid, appropriate for gestational age.  Pain/Pressure: Absent     Pelvic: Cervical exam performed in the presence of a chaperone Dilation: 1 Effacement (%): 50 Station: -2  Extremities: Normal range of motion.  Edema: Trace  Mental Status: Normal mood and affect. Normal behavior. Normal judgment and thought content.   Foley Bulb for Cervical Ripening Procedure: Patient informed of R/B/A of procedure.  Appropriate time out taken. A sterile cervical exam was performed. The patient was placed in the lithotomy position and the cervix was brought into view with a sterile speculum and a ring forcep was used to guide the 28F foley balloon through the internal os of the cervix. Foley Balloon filled with 40cc of normal saline. Plug inserted  into end of the foley. Foley placed on tension and taped to medial thigh. All equipment was removed and accounted for. The patient tolerated the procedure well.   NST: reactive Baseline: 145 bpm, Variability: Good {> 6 bpm), Accelerations: Reactive, and Decelerations: Absent  Toco: regular, every 3-5 minutes There were no signs of tachysystole or hypertonus.   Assessment and Plan:  Pregnancy: G1P0000 at [redacted]w[redacted]d 1. Encounter for supervision of low-risk pregnancy in third trimester (Primary) - Doing well, eager for her IOL tonight.  2. [redacted] weeks gestation of pregnancy - Routine OB care   3. Post-term pregnancy, 40-42 weeks of gestation - Fetal nonstress test; Future  - S/p Outpatient placement of foley balloon catheter for cervical ripening.  - Induction of labor scheduled for tonight at 1200 am.  - Reassuring FHR tracing with no concerns at present.  - Warning signs given to patient to include return to MAU for heavy vaginal bleeding, - Rupture of membranes, painful uterine contractions q 5 mins or less, severe abdominal discomfort, decreased fetal movement.  Post-dates labor symptoms and general obstetric precautions including but not limited to vaginal bleeding, contractions, leaking of fluid and fetal movement were reviewed in detail with the patient. Please refer to After Visit Summary for other counseling recommendations.   Return in about 6 weeks (around 03/14/2024) for IN-PERSON, PP.  Future Appointments  Date Time Provider Department Center  02/02/2024 12:00 AM [facility] SCHED ROOM [facility] None  03/07/2024  2:55 PM Derick Fleeting, CNM [facility]    Derick Fleeting, CNM

## 2024-02-01 NOTE — Telephone Encounter (Signed)
 Preadmission screen

## 2024-02-02 ENCOUNTER — Other Ambulatory Visit: Payer: Self-pay

## 2024-02-02 ENCOUNTER — Inpatient Hospital Stay (HOSPITAL_COMMUNITY)

## 2024-02-02 ENCOUNTER — Inpatient Hospital Stay (HOSPITAL_COMMUNITY): Admitting: Anesthesiology

## 2024-02-02 ENCOUNTER — Encounter (HOSPITAL_COMMUNITY)
Admission: AD | Disposition: A | Discharge status: Home / Self Care | Payer: Self-pay | Source: Home / Self Care | Attending: Family Medicine

## 2024-02-02 ENCOUNTER — Encounter (HOSPITAL_COMMUNITY): Payer: Self-pay | Admitting: Family Medicine

## 2024-02-02 DIAGNOSIS — O4593 Premature separation of placenta, unspecified, third trimester: Secondary | ICD-10-CM

## 2024-02-02 DIAGNOSIS — O99344 Other mental disorders complicating childbirth: Secondary | ICD-10-CM

## 2024-02-02 DIAGNOSIS — O480 Post-term pregnancy: Secondary | ICD-10-CM

## 2024-02-02 DIAGNOSIS — Z3A40 40 weeks gestation of pregnancy: Secondary | ICD-10-CM

## 2024-02-02 LAB — CBC
HCT: 33.8 % — ABNORMAL LOW (ref 36.0–46.0)
HCT: 27.5 % — ABNORMAL LOW (ref 36.0–46.0)
Hemoglobin: 11.8 g/dL — ABNORMAL LOW (ref 12.0–15.0)
Hemoglobin: 9.6 g/dL — ABNORMAL LOW (ref 12.0–15.0)
MCH: 30.9 pg (ref 26.0–34.0)
MCH: 30.9 pg (ref 26.0–34.0)
MCHC: 34.9 g/dL (ref 30.0–36.0)
MCHC: 34.9 g/dL (ref 30.0–36.0)
MCV: 88.5 fL (ref 80.0–100.0)
MCV: 88.4 fL (ref 80.0–100.0)
Platelets: 147 10*3/uL — ABNORMAL LOW (ref 150–400)
Platelets: 144 10*3/uL — ABNORMAL LOW (ref 150–400)
RBC: 3.82 MIL/uL — ABNORMAL LOW (ref 3.87–5.11)
RBC: 3.11 MIL/uL — ABNORMAL LOW (ref 3.87–5.11)
RDW: 13.2 % (ref 11.5–15.5)
RDW: 13.2 % (ref 11.5–15.5)
WBC: 19.2 10*3/uL — ABNORMAL HIGH (ref 4.0–10.5)
WBC: 15.6 10*3/uL — ABNORMAL HIGH (ref 4.0–10.5)
nRBC: 0 % (ref 0.0–0.2)
nRBC: 0 % (ref 0.0–0.2)

## 2024-02-02 LAB — CREATININE, SERUM
Creatinine, Ser: 0.45 mg/dL (ref 0.44–1.00)
GFR, Estimated: >60 mL/min (ref >60)

## 2024-02-02 LAB — RPR: RPR Ser Ql: NONREACTIVE

## 2024-02-02 SURGERY — Surgical Case
Anesthesia: General

## 2024-02-02 MED ORDER — WITCH HAZEL-GLYCERIN EX PADS: 1.0000 | MEDICATED_PAD | CUTANEOUS | Status: DC | PRN

## 2024-02-02 MED ORDER — SIMETHICONE 80 MG PO CHEW: 80.0000 mg | CHEWABLE_TABLET | ORAL | Status: DC | PRN

## 2024-02-02 MED ORDER — DIBUCAINE (PERIANAL) 1 % EX OINT: 1.0000 | TOPICAL_OINTMENT | CUTANEOUS | Status: DC | PRN

## 2024-02-02 MED ORDER — IBUPROFEN 600 MG PO TABS
600.0000 mg | ORAL_TABLET | Freq: Four times a day (QID) | ORAL | Status: DC
  Administered 2024-02-02 – 2024-02-04 (×10): 600 mg via ORAL
  Filled 2024-02-02 (×10): qty 1

## 2024-02-02 MED ORDER — ENOXAPARIN SODIUM 60 MG/0.6ML IJ SOSY
50.0000 mg | PREFILLED_SYRINGE | INTRAMUSCULAR | Status: DC
  Administered 2024-02-02 – 2024-02-03 (×2): 50 mg via SUBCUTANEOUS
  Filled 2024-02-02 (×2): qty 0.6

## 2024-02-02 MED ORDER — DEXAMETHASONE SODIUM PHOSPHATE 10 MG/ML IJ SOLN
INTRAMUSCULAR | Status: AC
  Filled 2024-02-02: qty 1

## 2024-02-02 MED ORDER — MEPERIDINE HCL 25 MG/ML IJ SOLN: 6.2500 mg | INTRAMUSCULAR | Status: DC | PRN

## 2024-02-02 MED ORDER — DEXMEDETOMIDINE HCL IN NACL 80 MCG/20ML IV SOLN
INTRAVENOUS | Status: AC
  Filled 2024-02-02: qty 20

## 2024-02-02 MED ORDER — ONDANSETRON HCL 4 MG/2ML IJ SOLN
INTRAMUSCULAR | Status: AC
  Filled 2024-02-02: qty 2

## 2024-02-02 MED ORDER — SUCCINYLCHOLINE CHLORIDE 200 MG/10ML IV SOSY
PREFILLED_SYRINGE | INTRAVENOUS | Status: DC | PRN
  Administered 2024-02-02: 140 mg via INTRAVENOUS

## 2024-02-02 MED ORDER — SERTRALINE HCL 50 MG PO TABS
50.0000 mg | ORAL_TABLET | Freq: Every day | ORAL | Status: DC
  Administered 2024-02-02 – 2024-02-03 (×2): 50 mg via ORAL
  Filled 2024-02-02 (×2): qty 1

## 2024-02-02 MED ORDER — OXYTOCIN-SODIUM CHLORIDE 30-0.9 UT/500ML-% IV SOLN
INTRAVENOUS | Status: DC | PRN
  Administered 2024-02-02: 300 mL via INTRAVENOUS

## 2024-02-02 MED ORDER — SUGAMMADEX SODIUM 200 MG/2ML IV SOLN
INTRAVENOUS | Status: DC | PRN
Start: 2024-02-02 — End: 2024-02-02
  Administered 2024-02-02: 200 mg via INTRAVENOUS

## 2024-02-02 MED ORDER — SUCCINYLCHOLINE CHLORIDE 200 MG/10ML IV SOSY
PREFILLED_SYRINGE | INTRAVENOUS | Status: AC
  Filled 2024-02-02: qty 10

## 2024-02-02 MED ORDER — METHYLERGONOVINE MALEATE 0.2 MG/ML IJ SOLN
INTRAMUSCULAR | Status: DC | PRN
  Administered 2024-02-02: .2 mg via INTRAMUSCULAR

## 2024-02-02 MED ORDER — FENTANYL CITRATE (PF) 100 MCG/2ML IJ SOLN
INTRAMUSCULAR | Status: AC
Start: 2024-02-02 — End: 2024-02-02
  Filled 2024-02-02: qty 2

## 2024-02-02 MED ORDER — TERBUTALINE SULFATE 1 MG/ML IJ SOLN: 0.2500 mg | Freq: Once | INTRAMUSCULAR | Status: DC | PRN

## 2024-02-02 MED ORDER — POLYETHYLENE GLYCOL 3350 17 G PO PACK
17.0000 g | PACK | ORAL | Status: DC
  Administered 2024-02-03: 17 g via ORAL
  Filled 2024-02-02: qty 1

## 2024-02-02 MED ORDER — PROPOFOL 10 MG/ML IV BOLUS
INTRAVENOUS | Status: AC
  Filled 2024-02-02: qty 40

## 2024-02-02 MED ORDER — ARTIFICIAL TEARS OPHTHALMIC OINT
TOPICAL_OINTMENT | OPHTHALMIC | Status: AC
Start: 2024-02-02 — End: 2024-02-02
  Filled 2024-02-02: qty 3.5

## 2024-02-02 MED ORDER — PRENATAL MULTIVITAMIN CH
1.0000 | ORAL_TABLET | Freq: Every day | ORAL | Status: DC
  Administered 2024-02-02 – 2024-02-03 (×2): 1 via ORAL
  Filled 2024-02-02 (×3): qty 1

## 2024-02-02 MED ORDER — ROCURONIUM BROMIDE 100 MG/10ML IV SOLN
INTRAVENOUS | Status: DC | PRN
  Administered 2024-02-02: 20 mg via INTRAVENOUS

## 2024-02-02 MED ORDER — ROCURONIUM BROMIDE 10 MG/ML (PF) SYRINGE
PREFILLED_SYRINGE | INTRAVENOUS | Status: AC
Start: 2024-02-02 — End: 2024-02-02
  Filled 2024-02-02: qty 10

## 2024-02-02 MED ORDER — GABAPENTIN 100 MG PO CAPS
300.0000 mg | ORAL_CAPSULE | Freq: Two times a day (BID) | ORAL | Status: DC
  Administered 2024-02-02 – 2024-02-04 (×5): 300 mg via ORAL
  Filled 2024-02-02 (×5): qty 3

## 2024-02-02 MED ORDER — CEFAZOLIN SODIUM-DEXTROSE 2-3 GM-%(50ML) IV SOLR
INTRAVENOUS | Status: DC | PRN
  Administered 2024-02-02: 2 g via INTRAVENOUS

## 2024-02-02 MED ORDER — ONDANSETRON HCL 4 MG/2ML IJ SOLN
4.0000 mg | Freq: Once | INTRAMUSCULAR | Status: AC | PRN
  Administered 2024-02-02: 4 mg via INTRAVENOUS

## 2024-02-02 MED ORDER — SODIUM CHLORIDE 0.9 % IV SOLN
INTRAVENOUS | Status: DC | PRN
  Administered 2024-02-02: 500 mg via INTRAVENOUS

## 2024-02-02 MED ORDER — DEXMEDETOMIDINE HCL IN NACL 80 MCG/20ML IV SOLN
INTRAVENOUS | Status: DC | PRN
  Administered 2024-02-02 (×2): 8 ug via INTRAVENOUS
  Administered 2024-02-02: 4 ug via INTRAVENOUS

## 2024-02-02 MED ORDER — FENTANYL CITRATE (PF) 250 MCG/5ML IJ SOLN
INTRAMUSCULAR | Status: AC
  Filled 2024-02-02: qty 5

## 2024-02-02 MED ORDER — DIPHENHYDRAMINE HCL 25 MG PO CAPS: 25.0000 mg | ORAL_CAPSULE | Freq: Four times a day (QID) | ORAL | Status: DC | PRN

## 2024-02-02 MED ORDER — SIMETHICONE 80 MG PO CHEW
80.0000 mg | CHEWABLE_TABLET | Freq: Three times a day (TID) | ORAL | Status: DC
  Administered 2024-02-02 – 2024-02-04 (×8): 80 mg via ORAL
  Filled 2024-02-02 (×8): qty 1

## 2024-02-02 MED ORDER — OXYCODONE HCL 5 MG/5ML PO SOLN: 5.0000 mg | Freq: Once | ORAL | Status: DC | PRN

## 2024-02-02 MED ORDER — MENTHOL 3 MG MT LOZG: 1.0000 | LOZENGE | OROMUCOSAL | Status: DC | PRN

## 2024-02-02 MED ORDER — SODIUM CHLORIDE 0.9 % IR SOLN
Status: DC | PRN
  Administered 2024-02-02 (×2): 1

## 2024-02-02 MED ORDER — SODIUM CHLORIDE 0.9 % IV SOLN
INTRAVENOUS | Status: AC
  Filled 2024-02-02: qty 5

## 2024-02-02 MED ORDER — OXYTOCIN-SODIUM CHLORIDE 30-0.9 UT/500ML-% IV SOLN: 1.0000 m[IU]/min | INTRAVENOUS | Status: DC

## 2024-02-02 MED ORDER — OXYTOCIN-SODIUM CHLORIDE 30-0.9 UT/500ML-% IV SOLN
2.5000 [IU]/h | INTRAVENOUS | Status: AC
  Administered 2024-02-02 (×2): 2.5 [IU]/h via INTRAVENOUS
  Filled 2024-02-02: qty 500

## 2024-02-02 MED ORDER — PROPOFOL 10 MG/ML IV BOLUS
INTRAVENOUS | Status: DC | PRN
Start: 2024-02-02 — End: 2024-02-02
  Administered 2024-02-02: 200 mg via INTRAVENOUS
  Administered 2024-02-02: 40 mg via INTRAVENOUS
  Administered 2024-02-02: 10 mg via INTRAVENOUS
  Administered 2024-02-02: 30 mg via INTRAVENOUS
  Administered 2024-02-02: 20 mg via INTRAVENOUS

## 2024-02-02 MED ORDER — OXYCODONE HCL 5 MG PO TABS: 5.0000 mg | ORAL_TABLET | Freq: Once | ORAL | Status: DC | PRN

## 2024-02-02 MED ORDER — PROPOFOL 500 MG/50ML IV EMUL
INTRAVENOUS | Status: AC
  Filled 2024-02-02: qty 50

## 2024-02-02 MED ORDER — FENTANYL CITRATE (PF) 100 MCG/2ML IJ SOLN
INTRAMUSCULAR | Status: DC | PRN
  Administered 2024-02-02: 250 ug via INTRAVENOUS
  Administered 2024-02-02 (×3): 100 ug via INTRAVENOUS
  Administered 2024-02-02: 50 ug via INTRAVENOUS

## 2024-02-02 MED ORDER — ONDANSETRON HCL 4 MG/2ML IJ SOLN
INTRAMUSCULAR | Status: DC | PRN
  Administered 2024-02-02: 4 mg via INTRAVENOUS

## 2024-02-02 MED ORDER — FENTANYL CITRATE (PF) 100 MCG/2ML IJ SOLN: 25.0000 ug | INTRAMUSCULAR | Status: DC | PRN

## 2024-02-02 MED ORDER — ONDANSETRON HCL 4 MG/2ML IJ SOLN
INTRAMUSCULAR | Status: AC
Start: 2024-02-02 — End: 2024-02-02
  Filled 2024-02-02: qty 2

## 2024-02-02 MED ORDER — SENNOSIDES-DOCUSATE SODIUM 8.6-50 MG PO TABS: 2.0000 | ORAL_TABLET | Freq: Every evening | ORAL | Status: DC | PRN

## 2024-02-02 MED ORDER — ACETAMINOPHEN 10 MG/ML IV SOLN
INTRAVENOUS | Status: AC
  Filled 2024-02-02: qty 100

## 2024-02-02 MED ORDER — COCONUT OIL OIL: 1.0000 | TOPICAL_OIL | Status: DC | PRN

## 2024-02-02 MED ORDER — FERROUS SULFATE 325 (65 FE) MG PO TABS
325.0000 mg | ORAL_TABLET | ORAL | Status: DC
  Administered 2024-02-03: 325 mg via ORAL
  Filled 2024-02-02: qty 1

## 2024-02-02 MED ORDER — DEXAMETHASONE SODIUM PHOSPHATE 10 MG/ML IJ SOLN
INTRAMUSCULAR | Status: DC | PRN
  Administered 2024-02-02: 10 mg via INTRAVENOUS

## 2024-02-02 MED ORDER — OXYTOCIN-SODIUM CHLORIDE 30-0.9 UT/500ML-% IV SOLN
INTRAVENOUS | Status: AC
  Filled 2024-02-02: qty 500

## 2024-02-02 MED ORDER — OXYCODONE HCL 5 MG PO TABS
5.0000 mg | ORAL_TABLET | ORAL | Status: DC | PRN
  Administered 2024-02-02 – 2024-02-04 (×10): 5 mg via ORAL
  Filled 2024-02-02 (×10): qty 1

## 2024-02-02 MED ORDER — TRANEXAMIC ACID-NACL 1000-0.7 MG/100ML-% IV SOLN
INTRAVENOUS | Status: AC
  Filled 2024-02-02: qty 100

## 2024-02-02 SURGICAL SUPPLY — 33 items
BENZOIN TINCTURE PRP APPL 2/3 (GAUZE/BANDAGES/DRESSINGS) ×1 IMPLANT
CANISTER SUCT 3000ML PPV (MISCELLANEOUS) ×1 IMPLANT
CHLORAPREP W/TINT 26 (MISCELLANEOUS) ×2 IMPLANT
CLAMP UMBILICAL CORD (MISCELLANEOUS) ×1 IMPLANT
DRSG OPSITE POSTOP 4X10 (GAUZE/BANDAGES/DRESSINGS) ×1 IMPLANT
ELECTRODE REM PT RTRN 9FT ADLT (ELECTROSURGICAL) ×1 IMPLANT
EXTRACTOR VACUUM KIWI (MISCELLANEOUS) ×1 IMPLANT
GAUZE PAD ABD 7.5X8 STRL (GAUZE/BANDAGES/DRESSINGS) IMPLANT
GAUZE SPONGE 4X4 12PLY STRL LF (GAUZE/BANDAGES/DRESSINGS) IMPLANT
GLOVE BIOGEL PI IND STRL 7.0 (GLOVE) ×2 IMPLANT
GLOVE BIOGEL PI IND STRL 7.5 (GLOVE) ×1 IMPLANT
GLOVE SURG SS PI 7.0 STRL IVOR (GLOVE) ×1 IMPLANT
GOWN STRL REUS W/ TWL LRG LVL3 (GOWN DISPOSABLE) ×2 IMPLANT
GOWN STRL REUS W/ TWL XL LVL3 (GOWN DISPOSABLE) ×1 IMPLANT
MAT PREVALON FULL STRYKER (MISCELLANEOUS) IMPLANT
NS IRRIG 1000ML POUR BTL (IV SOLUTION) ×1 IMPLANT
PACK C SECTION WH (CUSTOM PROCEDURE TRAY) ×1 IMPLANT
PAD ABD 7.5X8 STRL (GAUZE/BANDAGES/DRESSINGS) ×1 IMPLANT
PAD OB MATERNITY 4.3X12.25 (PERSONAL CARE ITEMS) ×1 IMPLANT
PAD PREP 24X48 CUFFED NSTRL (MISCELLANEOUS) ×1 IMPLANT
RETRACTOR WND ALEXIS 25 LRG (MISCELLANEOUS) ×1 IMPLANT
STRIP CLOSURE SKIN 1/2X4 (GAUZE/BANDAGES/DRESSINGS) ×1 IMPLANT
SUT MNCRL 0 VIOLET CTX 36 (SUTURE) ×2 IMPLANT
SUT MON AB 4-0 PS1 27 (SUTURE) ×1 IMPLANT
SUT PLAIN 2 0 XLH (SUTURE) ×1 IMPLANT
SUT VIC AB 0 CT1 36 (SUTURE) ×2 IMPLANT
SUT VIC AB 2-0 CTX 36 (SUTURE) IMPLANT
SUT VIC AB 3-0 CT1 TAPERPNT 27 (SUTURE) ×1 IMPLANT
SUT VIC AB 4-0 KS 27 (SUTURE) IMPLANT
SUT VICRYL+ 3-0 36IN CT-1 (SUTURE) IMPLANT
SUTURE PLAIN GUT 2.0 ETHICON (SUTURE) IMPLANT
TOWEL OR 17X24 6PK STRL BLUE (TOWEL DISPOSABLE) ×2 IMPLANT
WATER STERILE IRR 1000ML POUR (IV SOLUTION) ×1 IMPLANT

## 2024-02-02 NOTE — Discharge Summary (Signed)
 Postpartum Discharge Summary  Date of Service updated6/13/25     Patient Name: Valerie Bates DOB: 1996/02/25 MRN: [id number]  Date of admission: 02/01/2024 Delivery date:02/02/2024 Delivering provider: Raynell Caller Date of discharge: 02/03/2024 OB Clinic: [facility]  Admitting diagnosis: Pregnancy at 41/0. Post dates IOL.  Secondary diagnosis:  factor V leiden heterozygote   Discharge diagnosis: Term Pregnancy Delivered and Postdates, Factor V Leiden                                              Post partum procedures:none Augmentation: Cytotec  and OP Foley Complications: Placental Abruption  Hospital course: Patient brought in earlier than her midnight IOL for post dates at 41/0. She had an outpatient FB placed early afternoon on 6/11. On admission, she received a dual cytotec  25 and vaginally 25 at at 2045 on 6/11. The baby did well but at approximatley 0245, the foley bulb came out and she had prolonged fetal bradycardia. She was on 2cm and with baby still down in the OR, she underwent a stat PLTCS with diagnosis of abruption, .  Details of operation can be found in separate operative note. Patient had a postpartum course complicated bynothing.  She is ambulating,tolerating a regular diet, passing flatus, and urinating well.  Patient is discharged home in stable condition 02/03/24.  Newborn Data: Birth date:02/02/2024 Birth time:2:41 AM Gender:Female Living status:Living Apgars:9 ,9  Weight:3350 g  Magnesium Sulfate received: No BMZ received: No Rhophylac:N/A MMR:No Transfusion:No Immunizations administered: Immunization History  Administered Date(s) Administered   HPV 9-valent 10/02/1999   Hpv-Unspecified 01/30/1999, 05/02/1999, 08/01/1999   Influenza Inj Mdck Quad Pf 05/25/2022   Influenza-Unspecified 05/22/2022, 07/08/2023   PFIZER(Purple Top)SARS-COV-2 Vaccination 10/14/2019, 11/06/2019, 05/26/2020   Tdap 01/05/2017, 08/30/2017, 11/16/2023     Physical exam  Vitals:   02/02/24 1220 02/02/24 1620 02/02/24 2036 02/03/24 0549  BP: 123/74 134/76 129/73 129/72  Pulse: 87 83 79 68  Resp: 17 17 16 17   Temp: 98.7 F (37.1 C) 98.3 F (36.8 C) 98 F (36.7 C)   TempSrc: Oral Oral Oral Oral  SpO2: 97% 97% 97% 100%  Weight:      Height:       General: alert, cooperative, and no distress Lochia: appropriate Uterine Fundus: firm Incision: Dressing is clean, dry, and intact DVT Evaluation: No evidence of DVT seen on physical exam. Labs: Lab Results  Component Value Date   WBC 12.4 (H) 02/03/2024   HGB 9.2 (L) 02/03/2024   HCT 27.3 (L) 02/03/2024   MCV 90.4 02/03/2024   PLT 140 (L) 02/03/2024      Latest Ref Rng & Units 02/02/2024    5:05 PM  CMP  Creatinine 0.44 - 1.00 mg/dL 4.09    Edinburgh Score:    02/02/2024    5:35 AM  Edinburgh Postnatal Depression Scale Screening Tool  I have been able to laugh and see the funny side of things. 0  I have looked forward with enjoyment to things. 0  I have blamed myself unnecessarily when things went wrong. 1  I have been anxious or worried for no good reason. 1  I have felt scared or panicky for no good reason. 1  Things have been getting on top of me. 0  I have been so unhappy that I have had difficulty sleeping. 1  I have felt sad or miserable. 1  I have been so unhappy that I have been crying. 1  The thought of harming myself has occurred to me. 0  Edinburgh Postnatal Depression Scale Total 6      After visit meds:  Allergies as of 02/03/2024   No Known Allergies      Medication List     STOP taking these medications    Aspir-Low 81 MG tablet Generic drug: aspirin EC       TAKE these medications    budesonide  32 MCG/ACT nasal spray Commonly known as: RHINOCORT  AQUA Place 2 sprays into both nostrils daily.   ibuprofen  600 MG tablet Commonly known as: ADVIL  Take 1 tablet (600 mg total) by mouth every 6 (six) hours.   multivitamin-prenatal 27-0.8  MG Tabs tablet Take 1 tablet by mouth daily at 12 noon.   ondansetron  4 MG disintegrating tablet Commonly known as: ZOFRAN -ODT Take 1 tablet (4 mg total) by mouth every 8 (eight) hours as needed for nausea or vomiting.   oxyCODONE  5 MG immediate release tablet Commonly known as: Oxy IR/ROXICODONE  Take 1-2 tablets (5-10 mg total) by mouth every 4 (four) hours as needed for severe pain (pain score 7-10).   scopolamine  1 MG/3DAYS Commonly known as: TRANSDERM-SCOP Place 1 patch (1.5 mg total) onto the skin every 3 (three) days.   sertraline  100 MG tablet Commonly known as: ZOLOFT  Take 0.5 tablets (50 mg total) by mouth daily.   ZYRTEC PO Take by mouth daily.         Discharge home in stable condition Infant Feeding: Breast Infant Disposition:home with mother Discharge instruction: per After Visit Summary and Postpartum booklet. Activity: Advance as tolerated. Pelvic rest for 6 weeks.  Diet: routine diet Anticipated Birth Control: IUD Postpartum Appointment:4 weeks 1 week Incision check Additional Postpartum F/U: Incision check 1 week Future Appointments: Future Appointments  Date Time Provider Department Center  03/07/2024  2:55 PM Derick Fleeting, CNM [facility]   Follow up Visit:  Follow-up Information     [facility] Follow up in 1 week(s).   Specialty: Obstetrics and Gynecology Why: incision check Contact information: [address] [place]  [address] [phone]                    02/03/2024 Holmes Lusher, CNM

## 2024-02-02 NOTE — Transfer of Care (Signed)
 Immediate Anesthesia Transfer of Care Note  Patient: Valerie Bates  Procedure(s) Performed: CESAREAN DELIVERY  Patient Location: PACU  Anesthesia Type:General  Level of Consciousness: awake, alert , and oriented  Airway & Oxygen Therapy: Patient Spontanous Breathing  Post-op Assessment: Report given to RN and Post -op Vital signs reviewed and stable  Post vital signs: Reviewed and stable  Last Vitals:  Vitals Value Taken Time  BP 130/80 02/02/24 04:30  Temp 36.3 C 02/02/24 04:18  Pulse 91 02/02/24 04:33  Resp 16 02/02/24 04:33  SpO2 98 % 02/02/24 04:33  Vitals shown include unfiled device data.  Last Pain:  Vitals:   02/02/24 0418  TempSrc: Axillary  PainSc: 5          Complications: No notable events documented.

## 2024-02-02 NOTE — Lactation Note (Signed)
 This note was copied from a baby's chart. Lactation Consultation Note  Patient Name: Valerie Bates [id number] Date: 02/02/2024 Age:28 hours Reason for consult: Initial assessment;Primapara;1st time breastfeeding;Term;Breastfeeding assistance;RN request  P1- RN requested latching assistance for this MOB. Per MOB, they have attempted to latch infant multiple times, but had no success due to infant being sleepy. Infant's last successful feeding was around 1000. LC stimulated infant into waking up, then placed her on the right breast in the football hold. LC demonstrated how to stroke MOB's nipple from infant's nose to chin to elicit a gaping mouth. We attempted this for about 10 minutes, but infant would fuss and then fall asleep. At this time, LC encouraged hand expressing to stimulate infant and offer EBM. MOB consented. After 5 minutes of hand expressing, we were able to collect 4 mL of EBM. MOB has so much colostrum that she could have collected more if she wanted to. Infant was unwilling to drink from a spoon, so we placed the EBM into a nipple and had infant drink it this way. After the EBM, infant was very alert, so we placed her back on the breast with a 20 mm nipple shield for further stimulation. Infant latched a few times and suckled, but then would pull off and cry. After 10 more minutes of trying this, we decided to stop and place infant on MOB's chest for STS. LC provided MOB with a manual pump and size 18 mm flange because of the nipple shield use.   LC reviewed the first 24 hr birthday nap, day 2 cluster feeding, feeding infant on cue 8-12x in 24 hrs, not allowing infant to go over 3 hrs without a feeding, CDC milk storage guidelines, LC services handout and engorgement/breast care. LC encouraged MOB to call for further assistance as needed.  Maternal Data Has patient been taught Hand Expression?: Yes Does the patient have breastfeeding experience prior to this delivery?:  No  Feeding Mother's Current Feeding Choice: Breast Milk  LATCH Score Latch: Repeated attempts needed to sustain latch, nipple held in mouth throughout feeding, stimulation needed to elicit sucking reflex.  Audible Swallowing: None  Type of Nipple: Flat  Comfort (Breast/Nipple): Soft / non-tender  Hold (Positioning): Full assist, staff holds infant at breast  LATCH Score: 4   Lactation Tools Discussed/Used Tools: Pump;Flanges;Nipple Shields Nipple shield size: 20 Flange Size: 18 Breast pump type: Manual Pump Education: Setup, frequency, and cleaning;Milk Storage Reason for Pumping: nipple shield use Pumping frequency: 15-20 min every 3 hrs  Interventions Interventions: Breast feeding basics reviewed;Assisted with latch;Hand express;Breast compression;Adjust position;Support pillows;Position options;Expressed milk;Hand pump;Education;LC Services brochure  Discharge Discharge Education: Engorgement and breast care;Warning signs for feeding baby Pump: DEBP;Manual;Hands Free;Personal  Consult Status Consult Status: Follow-up Date: 02/03/24 Follow-up type: In-patient    Vernette Goo BS, IBCLC 02/02/2024, 5:14 PM

## 2024-02-02 NOTE — Op Note (Signed)
 Operative Note   SURGERY DATE: 02/02/2024  PRE-OP DIAGNOSIS:  *Pregnancy at 41/0 *Post dates IOL *Abruption at 2cm *Factor V Leiden heterozygote  POST-OP DIAGNOSIS: Same Delivered PP hemorrhage   PROCEDURE: Stat primary low transverse cesarean section via pfannenstiel skin incision with double layer uterine closure  SURGEON: Surgeons and Role:    * Raynell Caller, MD - Primary   ASSISTANT:   Melanie Spires, MD - Fellow  An experienced assistant was required given the standard of surgical care given the complexity of the case.  This assistant was needed for exposure, dissection, suctioning, retraction, instrument exchange, assisting with delivery with administration of fundal pressure, and for overall help during the procedure.  ANESTHESIA: general  ESTIMATED BLOOD LOSS:  DRAINS: UOP via indwelling foley  TOTAL IV FLUIDS: crystalloid  VTE PROPHYLAXIS: SCDs to bilateral lower extremities  ANTIBIOTICS: Ancef 2gm and azithromycin 500mg  IV given intraoperatively  SPECIMENS: Placenta to pathology  COMPLICATIONS: Abruption  INDICATIONS: I was called to patient's room due fetal bradycardia for several minutes. Foley had just come out per RN and patient on hands and knees. Patient placed on her back, I ordered a dose of terbutaline and I checked her and she was 2cm, intact and bright red blood on my glove. I called a stat c-section and I told the patient that if FHR was still down in the OR I would recommend proceeding with the stat c-section. In the OR, due to patient shaking, I was unable to tell FHR so c-section called.   FINDINGS: No intra-abdominal adhesions were noted. Grossly normal uterus, tubes and ovaries. Large amount of amniotic fluid with dark and bright red blood, cephalic, female infant, weight 3350gm, APGARs 9/9, intact placenta. I was only able to get a venous gas and it was pH 7.18, A-B deficit 10.1, Bicarb 18.3, CO2 49.   PROCEDURE IN  DETAIL: The patient was taken to the operating room where I was unable to confirm FHTs so stat c-section called. She was then splashed prepped and drapped and after she was intubated, A Clint Dana skin incision was made and the abdomen entered bluntly. The bladder blade was inserted and the vesicouterine peritoneum was identified and a low transverse hysterotomy was made with the scalpel until the endometrial cavity was breached and the amniotic sac ruptured with the above findings. This incision was extended bluntly and the infant's head, shoulders and body were delivered atraumatically.The cord was clamped x 2 and cut, and the infant was handed to the awaiting pediatricians, after delayed cord clamping was not done.  The placenta was then gradually expressed from the uterus and then the uterus was exteriorized and cleared of all clots and debris. The hysterotomy was repaired with a running suture of 1-0 monocryl. A second imbricating layer of 1-0 monocryl suture was then placed. Several figure-of-eight sutures of 1-0 monocryl were added to achieve excellent hemostasis.   The uterus and adnexa were then returned to the abdomen, and the hysterotomy and all operative sites were reinspected and excellent hemostasis was noted after irrigation and suction of the abdomen with warm saline.  The peritoneum was closed with a running stitch of 3-0 Vicryl. The fascia was reapproximated with 0 Vicryl in a simple running fashion bilaterally. The subcutaneous layer was then reapproximated with interrupted sutures of 2-0 plain gut, and the skin was then closed with 4-0 monocryl, in a subcuticular fashion.  The patient  tolerated the procedure well. Sponge, lap, needle, and instrument counts were  correct x 2. The patient was transferred to the recovery room awake, alert and breathing independently in stable condition.  Tyler Gallant MD Attending [facility])

## 2024-02-02 NOTE — Anesthesia Postprocedure Evaluation (Signed)
 Anesthesia Post Note  Patient: Valerie Bates  Procedure(s) Performed: CESAREAN DELIVERY     Patient location during evaluation: PACU Anesthesia Type: General Level of consciousness: awake and alert Pain management: pain level controlled Vital Signs Assessment: post-procedure vital signs reviewed and stable Respiratory status: spontaneous breathing, nonlabored ventilation, respiratory function stable and patient connected to nasal cannula oxygen Cardiovascular status: blood pressure returned to baseline and stable Postop Assessment: no apparent nausea or vomiting Anesthetic complications: no   No notable events documented.  Last Vitals:  Vitals:   02/02/24 0744 02/02/24 1220  BP: 137/87 123/74  Pulse: 73 87  Resp: 17 17  Temp: 36.5 C 37.1 C  SpO2: 98% 97%    Last Pain:  Vitals:   02/02/24 1245  TempSrc:   PainSc: 4                  Maribeth Jiles

## 2024-02-02 NOTE — Anesthesia Procedure Notes (Signed)
 Procedure Name: Intubation Date/Time: 02/02/2024 2:39 AM  Performed by: Babs Less, CRNAPre-anesthesia Checklist: Patient identified, Patient being monitored, Timeout performed, Emergency Drugs available and Suction available Patient Re-evaluated:Patient Re-evaluated prior to induction Oxygen Delivery Method: Circle System Utilized Preoxygenation: Pre-oxygenation with 100% oxygen Induction Type: IV induction, Rapid sequence and Cricoid Pressure applied Laryngoscope Size: 3 and Glidescope Grade View: Grade I Tube type: Oral Tube size: 7.0 mm Number of attempts: 1 Airway Equipment and Method: Stylet and Video-laryngoscopy Placement Confirmation: ETT inserted through vocal cords under direct vision, positive ETCO2 and breath sounds checked- equal and bilateral Secured at: 21 cm Tube secured with: Tape Dental Injury: Teeth and Oropharynx as per pre-operative assessment

## 2024-02-02 NOTE — Brief Op Note (Addendum)
 02/01/2024 - 02/02/2024  3:47 AM  PATIENT:  Valerie Bates  28 y.o. female  PRE-OPERATIVE DIAGNOSIS: fetal bradycardia. Abruption. 41/0 post dates IOL  POST-OPERATIVE DIAGNOSIS:  Same. PPH due abruption. Delivered  PROCEDURE:  stat PLTCS  SURGEON:  Surgeons and Role:    * Raynell Caller, MD - Primary  ASSISTANTS:    Melanie Spires, MD - Fellow   ANESTHESIA:   general  EBL:    IVF:  UOP: via foley  LOCAL MEDICATIONS USED:  None  SPECIMEN:  placenta  DISPOSITION OF SPECIMEN:  PATHOLOGY  COUNTS:  YES  TOURNIQUET:  * No tourniquets in log *  DICTATION: .Note written in EPIC  PLAN OF CARE: Admit to inpatient   PATIENT DISPOSITION:  PACU - hemodynamically stable.   Delay start of Pharmacological VTE agent (>24hrs) due to surgical blood loss or risk of bleeding: yes  Tyler Gallant MD Attending [facility] [employer] (Faculty Practice) 02/02/2024 Time: [phone]

## 2024-02-02 NOTE — Progress Notes (Signed)
 OB Note D/w patient re: last night and abruption of unknown etiology as causing the need for the stat c-section.  AF VS normal and stable NAD C/d/I pressure dressing, mildly distended, nttp.    Latest Ref Rng & Units 02/02/2024    5:05 PM 02/02/2024    6:21 AM 02/01/2024    8:00 PM  CBC  WBC 4.0 - 10.5 K/uL 15.6  19.2  10.2   Hemoglobin 12.0 - 15.0 g/dL 9.6  78.2  95.6   Hematocrit 36.0 - 46.0 % 27.5  33.8  37.7   Platelets 150 - 400 K/uL 144  147  173    Plan to repeat AM CBC, patient already on PO iron, no s/s of anemia.  All questions asked and answered.   Tyler Gallant MD Attending [facility] [employer] [facility]) 02/02/2024 Time: [phone]

## 2024-02-02 NOTE — Discharge Instructions (Signed)
°Cesarean Delivery, Care After °Refer to this sheet in the next few weeks. These instructions provide you with information on caring for yourself after your procedure. Your health care provider may also give you specific instructions. Your treatment has been planned according to current medical practices, but problems sometimes occur. Call your health care provider if you have any problems or questions after you go home. °HOME CARE INSTRUCTIONS  °· Only take over-the-counter or prescription medications as directed by your health care provider. °· Do not drink alcohol, especially if you are breastfeeding or taking medication to relieve pain. °· Do not  smoke tobacco. °· Continue to use good perineal care. Good perineal care includes: °¨ Wiping your perineum from front to back. °¨ Keeping your perineum clean. °· Check your surgical cut (incision) daily for increased redness, drainage, swelling, or separation of skin. °· Shower and clean your incision gently with soap and water every day, by letting warm and soapy water run over the incision, and then pat it dry. If your health care provider says it is okay, leave the incision uncovered. Use a bandage (dressing) if the incision is draining fluid or appears irritated. If the adhesive strips across the incision do not fall off within 7 days, carefully peel them off, after a shower. °· Hug a pillow when coughing or sneezing until your incision is healed. This helps to relieve pain. °· Do not use tampons, douches or have sexual intercourse, until your health care provider says it is okay. °· Wear a well-fitting bra that provides breast support. °· Limit wearing support panties or control-top hose. °· Drink enough fluids to keep your urine clear or pale yellow. °· Eat high-fiber foods such as whole grain cereals and breads, brown rice, beans, and fresh fruits and vegetables every day. These foods may help prevent or relieve constipation. °· Resume activities such as  climbing stairs, driving, lifting, exercising, or traveling as directed by your health care provider. °· Try to have someone help you with your household activities and your newborn for at least a few days after you leave the hospital. °· Rest as much as possible. Try to rest or take a nap when your newborn is sleeping. °· Increase your activities gradually. °· Do not lift more than 15lbs until directed by a provider. °· Keep all of your scheduled postpartum appointments. It is very important to keep your scheduled follow-up appointments. At these appointments, your health care provider will be checking to make sure that you are healing physically and emotionally. °SEEK MEDICAL CARE IF:  °· You are passing large clots from your vagina. Save any clots to show your health care provider. °· You have a foul smelling discharge from your vagina. °· You have trouble urinating. °· You are urinating frequently. °· You have pain when you urinate. °· You have a change in your bowel movements. °· You have increasing redness, pain, or swelling near your incision. °· You have pus draining from your incision. °· Your incision is separating. °· You have painful, hard, or reddened breasts. °· You have a severe headache. °· You have blurred vision or see spots. °· You feel sad or depressed. °· You have thoughts of hurting yourself or your newborn. °· You have questions about your care, the care of your newborn, or medications. °· You are dizzy or light-headed. °· You have a rash. °· You have pain, redness, or swelling at the site of the removed intravenous access (IV) tube. °· You have nausea   or vomiting. °· You stopped breastfeeding and have not had a menstrual period within 12 weeks of stopping. °· You are not breastfeeding and have not had a menstrual period within 12 weeks of delivery. °· You have a fever. °SEEK IMMEDIATE MEDICAL CARE IF: °· You have persistent pain. °· You have chest pain. °· You have shortness of breath. °· You  faint. °· You have leg pain. °· You have stomach pain. °· Your vaginal bleeding saturates 2 or more sanitary pads in 1 hour. °MAKE SURE YOU:  °· Understand these instructions. °· Will watch your condition. °· Will get help right away if you are not doing well or get worse. °Document Released: 05/01/2002 Document Revised: 12/24/2013 Document Reviewed: 04/05/2012 °ExitCare® Patient Information ©2015 [employer]. This information is not intended to replace advice given to you by your health care provider. Make sure you discuss any questions you have with your health care provider. ° ° °

## 2024-02-02 NOTE — Anesthesia Preprocedure Evaluation (Addendum)
 Anesthesia Evaluation  Patient identified by MRN, date of birth, ID band Patient awake    Reviewed: Allergy & Precautions, H&P , NPO status , Patient's Chart, lab work & pertinent test results  Airway Mallampati: I  TM Distance: >3 FB Neck ROM: Full    Dental no notable dental hx.    Pulmonary neg pulmonary ROS   Pulmonary exam normal breath sounds clear to auscultation       Cardiovascular negative cardio ROS Normal cardiovascular exam Rhythm:Regular Rate:Normal     Neuro/Psych  Headaches PSYCHIATRIC DISORDERS Anxiety Depression    negative neurological ROS  negative psych ROS   GI/Hepatic negative GI ROS, Neg liver ROS,,,  Endo/Other  negative endocrine ROS    Renal/GU negative Renal ROS  negative genitourinary   Musculoskeletal negative musculoskeletal ROS (+)    Abdominal   Peds negative pediatric ROS (+)  Hematology negative hematology ROS (+)   Anesthesia Other Findings   Reproductive/Obstetrics negative OB ROS                              Anesthesia Physical Anesthesia Plan  ASA: 3 and emergent  Anesthesia Plan: General   Post-op Pain Management:    Induction: Intravenous  PONV Risk Score and Plan: 3 and Ondansetron  and Dexamethasone  Airway Management Planned: Oral ETT  Additional Equipment:   Intra-op Plan:   Post-operative Plan: Extubation in OR  Informed Consent:   Plan Discussed with: Anesthesiologist  Anesthesia Plan Comments: (  )         Anesthesia Quick Evaluation

## 2024-02-03 LAB — CBC
HCT: 27.3 % — ABNORMAL LOW (ref 36.0–46.0)
Hemoglobin: 9.2 g/dL — ABNORMAL LOW (ref 12.0–15.0)
MCH: 30.5 pg (ref 26.0–34.0)
MCHC: 33.7 g/dL (ref 30.0–36.0)
MCV: 90.4 fL (ref 80.0–100.0)
Platelets: 140 10*3/uL — ABNORMAL LOW (ref 150–400)
RBC: 3.02 MIL/uL — ABNORMAL LOW (ref 3.87–5.11)
RDW: 13.5 % (ref 11.5–15.5)
WBC: 12.4 10*3/uL — ABNORMAL HIGH (ref 4.0–10.5)
nRBC: 0 % (ref 0.0–0.2)

## 2024-02-03 MED ORDER — IBUPROFEN 600 MG PO TABS
600.0000 mg | ORAL_TABLET | Freq: Four times a day (QID) | ORAL | 0 refills | Status: DC
Start: 2024-02-03 — End: 2024-02-04

## 2024-02-03 MED ORDER — OXYCODONE HCL 5 MG PO TABS: 5.0000 mg | ORAL_TABLET | ORAL | 0 refills | Status: DC | PRN

## 2024-02-03 NOTE — Social Work (Signed)
 MOB was referred for history of depression/anxiety. * Referral screened out by Clinical Social Worker because none of the following criteria appear to apply: ~ History of anxiety/depression during this pregnancy, or of post-partum depression following prior delivery. ~ Diagnosis of anxiety and/or depression within last 3 years OR * MOB's symptoms currently being treated with medication and/or therapy. Per prenatal care records, MOB takes Zoloft to treat anxiety and depression.   Please contact the Clinical Social Worker if needs arise, by [facility] request, or if MOB scores greater than 9/yes to question 10 on Edinburgh Postpartum Depression Screen. MOB completed [place] with score: 6.   Nickolas Barr, MSW, LCSW Clinical Social Worker  [phone] 02-Jun-2024  8:56 AM

## 2024-02-03 NOTE — Lactation Note (Signed)
 This note was copied from a baby's chart. Lactation Consultation Note  Patient Name: Valerie Bates [id number] Date: 02/03/2024 Age:28 hours  Attempted to see mom but she was sleeping.   Maternal Data    Feeding    LATCH Score                    Lactation Tools Discussed/Used    Interventions    Discharge    Consult Status      Anais Denslow G 02/03/2024, 11:23 PM

## 2024-02-04 ENCOUNTER — Other Ambulatory Visit (HOSPITAL_COMMUNITY): Payer: Self-pay

## 2024-02-04 DIAGNOSIS — Z98891 History of uterine scar from previous surgery: Secondary | ICD-10-CM

## 2024-02-04 MED ORDER — FERROUS SULFATE 325 (65 FE) MG PO TABS
325.0000 mg | ORAL_TABLET | ORAL | 0 refills | Status: DC
  Filled 2024-02-04: qty 45, 90d supply, fill #0

## 2024-02-04 MED ORDER — SENNA 8.6 MG PO TABS
1.0000 | ORAL_TABLET | Freq: Every evening | ORAL | 0 refills | Status: DC | PRN
  Filled 2024-02-04: qty 60, 60d supply, fill #0

## 2024-02-04 MED ORDER — ENOXAPARIN SODIUM 60 MG/0.6ML IJ SOSY
50.0000 mg | PREFILLED_SYRINGE | INTRAMUSCULAR | 0 refills | Status: DC
  Filled 2024-02-04: qty 25.2, 42d supply, fill #0

## 2024-02-04 MED ORDER — IBUPROFEN 600 MG PO TABS
600.0000 mg | ORAL_TABLET | Freq: Four times a day (QID) | ORAL | 0 refills | Status: DC | PRN
  Filled 2024-02-04 (×2): qty 60, 15d supply, fill #0

## 2024-02-04 MED ORDER — OXYCODONE HCL 5 MG PO TABS
5.0000 mg | ORAL_TABLET | Freq: Four times a day (QID) | ORAL | 0 refills | Status: DC | PRN
  Filled 2024-02-04 (×2): qty 15, 4d supply, fill #0

## 2024-02-04 NOTE — Lactation Note (Signed)
 This note was copied from a baby's chart. Lactation Consultation Note  Patient Name: Valerie Bates [id number] Date: 02/04/2024 Age:28 hours Reason for consult: Follow-up assessment;Primapara;1st time breastfeeding;Term;Infant weight loss (6 % weight loss,) LC reviewed the doc flow sheets and the baby has been feeding 10 -30 mins at the breast, wet and stools WNL. @ 50 hours Bili skin check 6.9  Per mom breast feeding is going so much better and last night the baby cluster fed . When feeding hearing swallows. Mom denies sore nipples.  Per mom hasn't had to use the Nipple Shield since the 1st time.  LC reviewed breast feeding D/C teaching, including engorgement prevention and tx. Mom is aware of LC resources.   Maternal Data Has patient been taught Hand Expression?: Yes Does the patient have breastfeeding experience prior to this delivery?: No  Feeding Mother's Current Feeding Choice: Breast Milk  LATCH Score - baby asleep , recently fed at 12 N for 10 mins   LC unable to assess latch due to baby just feeding at 12 Noon.  Lactation Tools Discussed/Used Pump Education: Milk Storage  Interventions Interventions: Breast feeding basics reviewed;Education;LC Services brochure;CDC milk storage guidelines;CDC Guidelines for Breast Pump Cleaning  Discharge Discharge Education: Engorgement and breast care;Warning signs for feeding baby Pump: DEBP;Manual;Hands Free;Personal  Consult Status Consult Status: Complete Date: 02/04/24    Richarda Chance 02/04/2024, 12:36 PM

## 2024-02-04 NOTE — Discharge Summary (Signed)
 Postpartum Discharge Summary  Date of Service updated     Patient Name: Valerie Bates DOB: [birth year] MRN: [id number]  Date of admission: 02/01/2024 Delivery date:02/02/2024 Delivering provider: Raynell Caller Date of discharge: 02/04/2024  Admitting diagnosis: Indication for care in labor or delivery [O75.9] Intrauterine pregnancy: [redacted]w[redacted]d     Secondary diagnosis:  Principal Problem:   Status post primary low transverse cesarean section Active Problems:   Anxiety   Factor V Leiden mutation [facility])   Supervision of low-risk pregnancy   Depression  Additional problems: None    Discharge diagnosis: Term Pregnancy Delivered                                              Post partum procedures:None Augmentation: Cytotec  and OP Foley Complications: Placental Abruption  Hospital course: Induction of Labor With Cesarean Section   28 y.o. yo G1P1001 at [redacted]w[redacted]d was admitted to the hospital 02/01/2024 for induction of labor. Patient had a labor course significant for prolonged fetal bradycardia at 2 cm which required STAT C/S. Found to have a placental abruption during delivery. Delivery details are as follows: Membrane Rupture Time/Date: 2:41 AM,02/02/2024  Delivery Method:C-Section, Low Transverse Operative Delivery:N/A Details of operation can be found in separate operative Note.  Patient had a postpartum course complicated by none. She does have FVL and is sent home on 6 weeks of lovenox  prophylaxis. She is ambulating, tolerating a regular diet, passing flatus, and urinating well.  Patient is discharged home in stable condition on 02/04/24.      Newborn Data: Birth date:02/02/2024 Birth time:2:41 AM Gender:Female Living status:Living Apgars:9 ,9  Weight:3350 g                               Magnesium Sulfate received: No BMZ received: No Rhophylac:No MMR:No T-DaP:Given prenatally Flu: N/A RSV Vaccine received: No Transfusion:No  Immunizations received: Immunization  History  Administered Date(s) Administered   HPV 9-valent 10/02/1999   Hpv-Unspecified 01/30/1999, 05/02/1999, 08/01/1999   Influenza Inj Mdck Quad Pf 05/25/2022   Influenza-Unspecified 05/22/2022, 07/08/2023   PFIZER(Purple Top)SARS-COV-2 Vaccination 10/14/2019, 11/06/2019, 05/26/2020   Tdap 01/05/2017, 08/30/2017, 11/16/2023    Physical exam  Vitals:   02/03/24 2125 02/03/24 2348 02/04/24 0537 02/04/24 0919  BP: 137/73 138/86 124/78   Pulse: 96  (!) 104   Resp: 18  16   Temp: 98.2 F (36.8 C)   98.1 F (36.7 C)  TempSrc: Oral   Oral  SpO2: 98%  97%   Weight:      Height:       General: alert, cooperative, and no distress Lochia: appropriate Uterine Fundus: firm Incision: Dressing is clean, dry, and intact DVT Evaluation: No evidence of DVT seen on physical exam. Labs: Lab Results  Component Value Date   WBC 12.4 (H) 02/03/2024   HGB 9.2 (L) 02/03/2024   HCT 27.3 (L) 02/03/2024   MCV 90.4 02/03/2024   PLT 140 (L) 02/03/2024      Latest Ref Rng & Units 02/02/2024    5:05 PM  CMP  Creatinine 0.44 - 1.00 mg/dL 4.49    Edinburgh Score:    02/02/2024    5:35 AM  Edinburgh Postnatal Depression Scale Screening Tool  I have been able to laugh and see the funny side of things. 0  I  have looked forward with enjoyment to things. 0  I have blamed myself unnecessarily when things went wrong. 1  I have been anxious or worried for no good reason. 1  I have felt scared or panicky for no good reason. 1  Things have been getting on top of me. 0  I have been so unhappy that I have had difficulty sleeping. 1  I have felt sad or miserable. 1  I have been so unhappy that I have been crying. 1  The thought of harming myself has occurred to me. 0  Edinburgh Postnatal Depression Scale Total 6   No data recorded  After visit meds:  Allergies as of 02/04/2024   No Known Allergies      Medication List     STOP taking these medications    Aspir-Low 81 MG tablet Generic  drug: aspirin EC   ondansetron  4 MG disintegrating tablet Commonly known as: ZOFRAN -ODT   scopolamine  1 MG/3DAYS Commonly known as: TRANSDERM-SCOP       TAKE these medications    budesonide  32 MCG/ACT nasal spray Commonly known as: RHINOCORT  AQUA Place 2 sprays into both nostrils daily.   enoxaparin  60 MG/0.6ML injection Commonly known as: LOVENOX  Inject 0.5 mLs (50 mg total) into the skin daily.   ferrous sulfate  325 (65 FE) MG tablet Take 1 tablet (325 mg total) by mouth every other day. Start taking on: February 05, 2024   ibuprofen  600 MG tablet Commonly known as: ADVIL  Take 1 tablet (600 mg total) by mouth every 6 (six) hours as needed.   multivitamin-prenatal 27-0.8 MG Tabs tablet Take 1 tablet by mouth daily at 12 noon.   oxyCODONE  5 MG immediate release tablet Commonly known as: Oxy IR/ROXICODONE  Take 1 tablet (5 mg total) by mouth every 6 (six) hours as needed for severe pain (pain score 7-10).   senna 8.6 MG Tabs tablet Commonly known as: SENOKOT Take 1 tablet (8.6 mg total) by mouth at bedtime as needed for mild constipation.   sertraline  100 MG tablet Commonly known as: ZOLOFT  Take 0.5 tablets (50 mg total) by mouth daily.   ZYRTEC PO Take by mouth daily.         Discharge home in stable condition Infant Feeding: Breast Infant Disposition:home with mother Discharge instruction: per After Visit Summary and Postpartum booklet. Activity: Advance as tolerated. Pelvic rest for 6 weeks.  Diet: routine diet Future Appointments: Future Appointments  Date Time Provider Department Center  03/07/2024  2:55 PM Derick Fleeting, CNM [facility]   Follow up Visit:  Follow-up Information     [facility] Follow up in 1 week(s).   Specialty: Obstetrics and Gynecology Why: incision check Contact information: [address] [place]  [address] [phone]                02/04/2024 Maud Sorenson, MD

## 2024-02-06 ENCOUNTER — Encounter: Payer: Self-pay | Admitting: Certified Nurse Midwife

## 2024-02-06 LAB — SURGICAL PATHOLOGY

## 2024-02-10 ENCOUNTER — Other Ambulatory Visit: Payer: Self-pay

## 2024-02-10 ENCOUNTER — Ambulatory Visit

## 2024-02-10 ENCOUNTER — Encounter: Payer: Self-pay | Admitting: General Practice

## 2024-02-10 VITALS — BP 110/77 | HR 96 | Ht 67.0 in | Wt 189.0 lb

## 2024-02-10 DIAGNOSIS — Z5189 Encounter for other specified aftercare: Secondary | ICD-10-CM

## 2024-02-10 NOTE — Progress Notes (Signed)
 Patient presents to office today for wound check following primary c-section on 6/12. She reports doing well since then with mild soreness/tenderness. BP 110/77. Incision appears to be healing well & is clean, dry and intact. Wound care and signs & symptoms of infection reviewed with patient. Discussed continuing ibuprofen  q 6 hours to decrease pain/inflammation. Patient will return 7/16 for pp visit or sooner if needed.   Jewelene Morton RN BSN 02/10/24

## 2024-02-14 NOTE — Progress Notes (Signed)
 Subjective: Postpartum Day 1: Cesarean Delivery (late entry, DC note had been written in error on this date) Patient reports incisional pain and tolerating PO.    Objective: Vital signs in last 24 hours:    Physical Exam:  General: alert and no distress Lochia: appropriate Uterine Fundus: firm Incision: no significant drainage DVT Evaluation: No evidence of DVT seen on physical exam.  No results for input(s): HGB, HCT in the last 72 hours.  Assessment/Plan: Status post Cesarean section. Doing well postoperatively.  Continue current care.  Earnie Pouch, CNM 02/14/2024, 8:37 PM

## 2024-03-07 ENCOUNTER — Ambulatory Visit: Payer: Self-pay | Admitting: Certified Nurse Midwife

## 2024-03-07 ENCOUNTER — Other Ambulatory Visit: Payer: Self-pay

## 2024-03-07 DIAGNOSIS — F419 Anxiety disorder, unspecified: Secondary | ICD-10-CM

## 2024-03-07 DIAGNOSIS — Z538 Procedure and treatment not carried out for other reasons: Secondary | ICD-10-CM | POA: Diagnosis not present

## 2024-03-07 DIAGNOSIS — Z30013 Encounter for initial prescription of injectable contraceptive: Secondary | ICD-10-CM

## 2024-03-07 DIAGNOSIS — Z30430 Encounter for insertion of intrauterine contraceptive device: Secondary | ICD-10-CM

## 2024-03-07 DIAGNOSIS — Z3202 Encounter for pregnancy test, result negative: Secondary | ICD-10-CM

## 2024-03-07 DIAGNOSIS — Z3009 Encounter for other general counseling and advice on contraception: Secondary | ICD-10-CM

## 2024-03-07 DIAGNOSIS — F3289 Other specified depressive episodes: Secondary | ICD-10-CM

## 2024-03-07 LAB — POCT PREGNANCY, URINE: Preg Test, Ur: NEGATIVE

## 2024-03-07 MED ORDER — MEDROXYPROGESTERONE ACETATE 150 MG/ML IM SUSY
150.0000 mg | PREFILLED_SYRINGE | Freq: Once | INTRAMUSCULAR | Status: AC
  Administered 2024-03-07: 150 mg via INTRAMUSCULAR

## 2024-03-07 MED ORDER — LIDOCAINE HCL URETHRAL/MUCOSAL 2 % EX GEL
1.0000 | Freq: Once | CUTANEOUS | Status: AC
  Administered 2024-03-07: 1 via TOPICAL

## 2024-03-07 NOTE — Progress Notes (Signed)
 Postartum Visit Note  Valerie Bates is a 28 y.o. G81P1001 female who presents for a postpartum visit. She is 4 weeks 6 days postpartum following a primary cesarean section.  I have fully reviewed the prenatal and intrapartum course. The delivery was at [redacted]w[redacted]d.  Anesthesia: general. Postpartum course has been uncomplicated. Baby is doing well. Baby is feeding by breast. Bleeding no bleeding. Bowel function is normal. Bladder function is normal. Patient is not sexually active. Contraception method is IUD. Postpartum depression screening: positive.    The pregnancy intention screening data noted above was reviewed. Potential methods of contraception were discussed. The patient elected to proceed with IUD or IUS.   Edinburgh Postnatal Depression Scale - 03/07/24 1647       Edinburgh Postnatal Depression Scale:  In the Past 7 Days   I have been able to laugh and see the funny side of things. 0    I have looked forward with enjoyment to things. 0    I have blamed myself unnecessarily when things went wrong. 2    I have been anxious or worried for no good reason. 2    I have felt scared or panicky for no good reason. 2    Things have been getting on top of me. 2    I have been so unhappy that I have had difficulty sleeping. 0    I have felt sad or miserable. 1    I have been so unhappy that I have been crying. 1    The thought of harming myself has occurred to me. 0    Edinburgh Postnatal Depression Scale Total 10         Health Maintenance Due  Topic Date Due   Pneumococcal Vaccine 76-24 Years old (1 of 2 - PCV) Never done   Hepatitis B Vaccines (1 of 3 - 19+ 3-dose series) Never done   HPV VACCINES (1 - 3-dose SCDM series) 12/08/2022   COVID-19 Vaccine (4 - 2024-25 season) 04/24/2023   The following portions of the patient's history were reviewed and updated as appropriate: allergies, current medications, past family history, past medical history, past social history, past  surgical history, and problem list.  Review of Systems Pertinent items noted in HPI and remainder of comprehensive ROS otherwise negative.  Objective:  LMP 04/21/2023 (Exact Date)    Constitutional: Alert, oriented female in no physical distress.  HEENT: PERRLA Skin: normal color and turgor, no rash Cardiovascular: normal rate & rhythm Respiratory: normal effort, no problems with respiration noted GI: Abd soft, non-tender MS: Extremities nontender, no edema, normal ROM Neurologic: Alert and oriented x 4.  GU: no CVA tenderness Pelvic: normal external female genitalia, no lesions or bleeding noted (see IUD insertion note) Breasts: normal lactating breasts, no edema or signs of nipple damage Incision: well-approximated without exudate, redness or edema.   Assessment:  1. Postpartum care and examination - Recovering well, seeing a therapist to process the stat Cesarean  2. Birth control counseling - Discussed use of Depo until we can get her an appointment with Dr. Cleatus for IUD insertion  3. Unsuccessful IUD insertion (Primary) - Pregnancy, urine POC - lidocaine  (XYLOCAINE ) 2 % jelly 1 Application  4. Initiation of Depo Provera  - medroxyPROGESTERone  Acetate SUSY 150 mg   Plan:   Essential components of care per ACOG recommendations:  1.  Mood and well being: Patient with negative depression screening today. Reviewed local resources for support.  - Patient tobacco use? No.   -  hx of drug use? No.    2. Infant care and feeding:  -Patient currently breastmilk feeding? Yes, witnessed latch - baby doing well with audible swallows and no pain -Social determinants of health (SDOH) reviewed in EPIC. No concerns  3. Sexuality, contraception and birth spacing - Patient does not want a pregnancy in the next year.   - Reviewed reproductive life planning. Reviewed contraceptive methods based on pt preferences and effectiveness.  Patient desired Hormonal Injection and IUD or IUS  today.   - Discussed birth spacing of 18 months  4. Sleep and fatigue -Encouraged family/partner/community support of 4 hrs of uninterrupted sleep to help with mood and fatigue  5. Physical Recovery  - Discussed patients delivery and complications. She describes her labor as mixed. - Patient had a C-section emergent for abruption. - Patient has urinary incontinence? No. - Patient is safe to resume physical and sexual activity  6.  Health Maintenance - HM due items addressed Yes - Last pap smear  Diagnosis  Date Value Ref Range Status  06/03/2022      - Negative for intraepithelial lesion or malignancy (NILM)   Pap smear not done at today's visit.  -Breast Cancer screening indicated? No.   7. Chronic Disease/Pregnancy Condition follow up: None - PCP follow up as needed  Cornell JONELLE Bates, CNM [facility] [employer], [facility]

## 2024-03-09 ENCOUNTER — Encounter: Payer: Self-pay | Admitting: Certified Nurse Midwife

## 2024-03-09 DIAGNOSIS — F419 Anxiety disorder, unspecified: Secondary | ICD-10-CM

## 2024-03-09 DIAGNOSIS — F3289 Other specified depressive episodes: Secondary | ICD-10-CM

## 2024-03-10 NOTE — Progress Notes (Signed)
    IUD Insertion Procedure Note Patient identified, informed consent performed, consent signed.   Discussed risks of irregular bleeding, cramping, infection, malpositioning or misplacement of the IUD outside the uterus which may require further procedure such as laparoscopy. Also discussed >99% contraception efficacy, increased risk of ectopic pregnancy with failure of method.   Emphasized that this did not protect against STIs, condoms recommended during all sexual encounters. Time out was performed.  Chaperone present.  Urine pregnancy test negative.  Speculum placed in the vagina.  Cervix visualized.  Cleaned with Betadine x 2.  Grasped anteriorly with a single tooth tenaculum.  Attempted IUD insertion, unable to advance past internal os and pt experiencing a lot of cramping. Tenaculum removed and lidocaine  jelly placed against the os. Waited 2-36min and replaced tenaculum. Dilator attempted, causing more cramping and still unable to advance past cervical os. Tenaculum removed, swabs used to staunch bleeding and then speculum removed.   After shared decision making discussion with pt, decided to give Depo today and have her return with Dr. Cleatus for intracervical numbing and IUD placement.   Return for IUD insertion with Dr. Cleatus.  Future Appointments  Date Time Provider Department Center  05/01/2024  9:35 AM Cleatus Moccasin, MD [facility]    Cornell Finder, CNM, MSN, [facility] Certified Nurse Midwife, [facility]

## 2024-03-13 DIAGNOSIS — Z0289 Encounter for other administrative examinations: Secondary | ICD-10-CM

## 2024-03-13 MED ORDER — SERTRALINE HCL 100 MG PO TABS: 100.0000 mg | ORAL_TABLET | Freq: Every day | ORAL | 1 refills | Status: DC

## 2024-03-13 NOTE — Progress Notes (Signed)
 Patient requested increase of Zoloft  to 100 mg daily. She is currrently seeing a therapist who feels this would be helpful. Provider agrees. Order not placed day of visit. Patient sent MyChart message reminder of need for increased dose. RN placed verbal order.  Vernell RN 03/13/24

## 2024-03-13 NOTE — Addendum Note (Signed)
 Addended by: HONORE VERNELL BRAVO on: 03/13/2024 12:19 PM   Modules accepted: Orders

## 2024-03-19 MED ORDER — SERTRALINE HCL 100 MG PO TABS: 100.0000 mg | ORAL_TABLET | Freq: Every day | ORAL | 4 refills | Status: AC

## 2024-03-26 ENCOUNTER — Other Ambulatory Visit: Payer: Self-pay | Admitting: Certified Nurse Midwife

## 2024-03-26 ENCOUNTER — Telehealth: Payer: Self-pay | Admitting: Lactation Services

## 2024-03-26 DIAGNOSIS — O9113 Abscess of breast associated with lactation: Secondary | ICD-10-CM

## 2024-03-26 NOTE — Telephone Encounter (Signed)
 Called and spoke with patient. Patient reports she had Mastitis and went to Urgent Care and received ATB to cover MRSA as she has History of MRSA, she is on Clindamycin. She has an actively draining abscess to the right breast at 4-5 o'clock position.  Although draining still has a large lump. Milk is still flowing from the breast, she is still breast feeding. She is out of town until Wednesday or Thursday, she is in [place]  currently.   Called [facility] to schedule an [facility] /Mammogram, spoke to [facility]. They will her to schedule. Will follow up as needed.

## 2024-03-29 ENCOUNTER — Ambulatory Visit
Admission: RE | Admit: 2024-03-29 | Discharge: 2024-03-29 | Disposition: A | Discharge status: Home / Self Care | Source: Ambulatory Visit | Attending: Certified Nurse Midwife | Admitting: Certified Nurse Midwife

## 2024-03-29 ENCOUNTER — Inpatient Hospital Stay
Admission: RE | Admit: 2024-03-29 | Discharge: 2024-03-29 | Discharge status: Home / Self Care | Source: Ambulatory Visit | Attending: Certified Nurse Midwife | Admitting: Certified Nurse Midwife

## 2024-03-29 DIAGNOSIS — O9113 Abscess of breast associated with lactation: Secondary | ICD-10-CM

## 2024-04-18 ENCOUNTER — Encounter: Payer: Self-pay | Admitting: Certified Nurse Midwife

## 2024-04-18 DIAGNOSIS — R102 Pelvic and perineal pain: Secondary | ICD-10-CM

## 2024-04-30 ENCOUNTER — Encounter: Payer: Self-pay | Admitting: Obstetrics and Gynecology

## 2024-04-30 NOTE — Progress Notes (Unsigned)
    GYNECOLOGY OFFICE PROCEDURE NOTE  Valerie Bates is a 28 y.o. G1P1001 here for IUD insertion. No GYN concerns.  Last pap smear was on 05/2022 and was normal.  IUD Insertion Procedure Note Procedure: IUD insertion with {Blank single:19197::Mirena ,Skyla ,Liletta ,Kyleena ,Paragard} UPT: {Blank single:19197::Negative} GC/CT testing: {Blank single:19197::Up to date,Offered and declines,Offered and accepts}  Patient identified.  Risks, benefits and alternatives of procedure were discussed including irregular bleeding, cramping, infection, malpositioning or misplacement of the IUD outside the uterus which may require further procedure such as laparoscopy. Also discussed >99% contraception efficacy, increased risk of ectopic pregnancy with failure of method.   Emphasized that this did not protect against STIs, condoms recommended during all sexual encounters. Consent signed. Time out performed.   Speculum inserted and cervix visualized, prepped with 3 swabs of betadine. Intracervical block was done with 1% lidocaine  at 12/3/6/9 for a total of *** cc.   {Blank multiple:19196::Grasped with a single tooth tenaculum ,Uterus sounded to *** cm ,Cervical dilators used,IUD then inserted without difficulty per manufacturer's instructions and strings cut to 3 cm below cervical os and all instruments removed. Pt tolerated well with minimal pain and bleeding.}   Discussed concerning signs/symptoms and to call if heavy bleeding, severe abdominal pain, or fever in the following 3 weeks. Manufacturer pamphlet/patient information given. Reviewed timing of efficacy for contraception and to use an alternative form of birth control until that time.   Vina Solian, MD, FACOG Obstetrician & Gynecologist, [facility] for [facility], [facility]

## 2024-05-01 ENCOUNTER — Other Ambulatory Visit: Payer: Self-pay

## 2024-05-01 ENCOUNTER — Ambulatory Visit (INDEPENDENT_AMBULATORY_CARE_PROVIDER_SITE_OTHER): Payer: Self-pay | Admitting: Obstetrics and Gynecology

## 2024-05-01 ENCOUNTER — Encounter: Payer: Self-pay | Admitting: Obstetrics and Gynecology

## 2024-05-01 VITALS — BP 123/80 | HR 88 | Wt 186.0 lb

## 2024-05-01 DIAGNOSIS — Z30430 Encounter for insertion of intrauterine contraceptive device: Secondary | ICD-10-CM

## 2024-05-01 MED ORDER — LEVONORGESTREL 20 MCG/DAY IU IUD
1.0000 | INTRAUTERINE_SYSTEM | Freq: Once | INTRAUTERINE | Status: AC
  Administered 2024-05-01: 1 via INTRAUTERINE

## 2024-05-01 MED ORDER — LEVONORGESTREL 20.1 MCG/DAY IU IUD: 1.0000 | INTRAUTERINE_SYSTEM | Freq: Once | INTRAUTERINE | Status: DC

## 2024-07-25 ENCOUNTER — Encounter: Payer: Self-pay | Admitting: Physical Therapy

## 2024-07-25 ENCOUNTER — Ambulatory Visit: Admitting: Physical Therapy

## 2024-07-25 DIAGNOSIS — M533 Sacrococcygeal disorders, not elsewhere classified: Secondary | ICD-10-CM | POA: Diagnosis present

## 2024-07-25 DIAGNOSIS — M542 Cervicalgia: Secondary | ICD-10-CM | POA: Diagnosis present

## 2024-07-25 DIAGNOSIS — R2689 Other abnormalities of gait and mobility: Secondary | ICD-10-CM | POA: Insufficient documentation

## 2024-07-25 DIAGNOSIS — M5459 Other low back pain: Secondary | ICD-10-CM | POA: Diagnosis present

## 2024-07-25 DIAGNOSIS — R102 Pelvic and perineal pain unspecified side: Secondary | ICD-10-CM | POA: Insufficient documentation

## 2024-07-25 NOTE — Therapy (Signed)
 OUTPATIENT PHYSICAL THERAPY EVALUATION   Patient Name: Valerie Bates MRN: [id number] DOB:06/26/1996, 28 y.o., female Today's Date: 07/25/2024   PT End of Session - 07/25/24 1601     Visit Number 1    Number of Visits 10    Date for Recertification  10/03/24    PT Start Time 1547    PT Stop Time 1630    PT Time Calculation (min) 43 min    Activity Tolerance Patient tolerated treatment well;No increased pain    Behavior During Therapy [facility] for tasks assessed/performed          Past Medical History:  Diagnosis Date   Allergic rhinitis 06/21/2022   Allergy    Anxiety    Depression    Factor 5 Leiden mutation, heterozygous    Migraines    Past Surgical History:  Procedure Laterality Date   CESAREAN SECTION N/A 02/02/2024   Procedure: CESAREAN DELIVERY;  Surgeon: Izell Harari, MD;  Location: [facility] LD ORS;  Service: Obstetrics;  Laterality: N/A;   WISDOM TOOTH EXTRACTION  2015   Patient Active Problem List   Diagnosis Date Noted   Status post primary low transverse cesarean section 02/04/2024   Anxiety 10/01/2019   Depression 05/24/2019   Factor V Leiden mutation 01/05/2017   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 Pt is 6 months post partum today. Pt reported she feels the traumatic experience is related to the tensions she feels in the pelvic area. Pt has pelvic pain now with  sexual intercourse and with insertion of spectulum and during gynecological exams. Insertion of her IUD procedure also caused pelvic pain . It took trials to get IUD inserted due to pelvic pain with second trial requiring Lidocaine .   2) R LBP  - limits her from running since having baby ( 2 miles, run walk) and causes R LBP afterwards 5/10 , nonradiating . Pain lasts for 4-5 days. Worst with sitting for > 5 min and then sit to stand. Pt had R LBP since middle /  high school. Pt has tried PT and it gets better but pain comes back  . LBP worsened during pregnncy   3) B neck pain - tensions from stress and holding her head down at work, bottle feeding baby  . Tensions do not limit activities but is annoying. Pt is not breastfeeding but when she did breastfeed, neck tensions were worst.  Pt also gets jaw pain B, migraines also comes with neck pain and stress.  4) bladder urgency for the past 5 years prior to pregnancy. Pt has not leakage before getting to bathroom   PERTINENT HISTORY:  Factor 5 Leiden mutation, heterozygous   ( genetic condition puts her at risk for blood clots)   PAIN:  Are you having pain? Yes: see above   PRECAUTIONS: No  WEIGHT BEARING RESTRICTIONS: No   FALLS:  Has patient fallen in last 6 months? No  LIVING ENVIRONMENT: Lives with: husband and newborn  Lives in: one story  Stairs: back 5 STE with rail,front has ramp    OCCUPATION: Advice Worker,   PLOF: IND  PATIENT GOALS:  Be able to function without pain in low back, esp, decrease tension in neck, return to sexual function after pregnancy  Return to cardio, weight training without pain   OBJECTIVE:    [facility] PT Assessment - 07/25/24 1559       AROM   Overall AROM Comments LBP reproduced with L sideflexion,  distance from digit III to floor L sideflexion 50 cm, R 51 cm,  limited R trunk rotation compared to L      Palpation   SI assessment  R shoulder, R ilaic crest lowered      Ambulation/Gait    Gait Comments 1.14 m/s, excessive sway to R pelvis, limited R posterior rotation of thorax             Self-Care   Self-Care Other Self-Care Comments    Other Self-Care Comments  explained regional interdependent approach to address pain at different body parts, explained upcoming visits to realign posture and spine.pelvis to improve Sx and achieve goals, showed anatomy images of deep core system ,      Therapeutic Activites    Therapeutic Activities Other Therapeutic Activities    Other Therapeutic Activities inquired about meaningful tasks and role of learning proper body mechanics, future sessions to help realign pelvis and spine and to learn co-activation of deep core system when performing against gravity tasks . These improvements will help address pain and Sx     Neuro Re-ed    Neuro Re-ed Details  cued for alignment and proprioception of pelvis and LKC and spine in  sit to stand and for proper sitting posture to activate deep core system          HOME EXERCISE PROGRAM: See pt instruction section    ASSESSMENT:  CLINICAL IMPRESSION: Pt is a  28   yo  who presents with  following issues which impact QOL, ADL,  fitness, social and community activities:    1) pelvic pain 2) R LBP 3) B neck pain 4) bladder urgency   Pt's musculoskeletal assessment revealed uneven pelvic girdle and shoulder height, asymmetries to gait pattern, limited spinal /pelvic mobility, dyscoordination and strength of pelvic floor mm, C section scar restrictions, poor body mechanics which places strain on the abdominal/pelvic floor mm. These are deficits that indicate an ineffective intraabdominal pressure system associated with increased risk for pt's Sx.     Pt will benefit from propioception/  coordination/ body mechanics training and education with gravity-loaded tasks at work and home and  fitness modifications in order to gain a more effective intraabdominal pressure system to minimize Sx.  Advised pt to not perform sit-ups and crunches as these movement patterns lead to more downward forces on the pelvic floor, negatively impacting abdominopelvic/spinal dysfunctions.   Pt was provided education on etiology of Sx with anatomy, physiology explanation with images along with the benefits of customized pelvic PT Tx based on pt's medical conditions and musculoskeletal deficits.  Explained the physiology of deep core mm coordination and roles of pelvic floor function in urination, defecation, sexual function, and postural control with deep core mm system, and the role of posture and alignment to help pelvic issues.   Following Tx today which pt tolerated without complaints,  pt demo'd proper body mechanics to minimize straining pelvic floor.  Plan to address realignment of spine/ pelvis at next session to help promote optimize intra-abdominal pressure system (IAP)  for improved pelvic floor function, trunk stability, gait, balance, stabilization with mobility tasks.  Plan to address pelvic floor issues once pelvis and spine are realigned to yield better outcomes.  Regional interdependent approaches will yield greater benefits in pt's POC.   Pt benefits from skilled PT to address    1) pelvic pain 2) R LBP 3) B neck pain 4) bladder urgency.    OBJECTIVE IMPAIRMENTS decreased activity tolerance, decreased coordination, decreased endurance, decreased mobility, difficulty walking, decreased ROM, decreased strength, decreased safety awareness, hypomobility, increased muscle spasms, impaired flexibility, improper body mechanics, postural dysfunction, and pain. scar restrictions   ACTIVITY LIMITATIONS  self-care,  sleep, home chores, work tasks    PARTICIPATION LIMITATIONS:  community, gym activities    PERSONAL FACTORS   affecting patient's functional outcome:    REHAB POTENTIAL: Good   CLINICAL DECISION MAKING: Evolving/moderate complexity   EVALUATION COMPLEXITY: Moderate     PATIENT EDUCATION:    Education details: Showed pt anatomy images. Explained muscles attachments/ connection, physiology of deep core system/ spinal- thoracic-pelvis-lower kinetic chain as they relate to pt's presentation, Sx, and past Hx. Explained what and how these areas of deficits need to be restored to balance and function    See Therapeutic activity / neuromuscular re-education section  Answered pt's questions.   Person educated: Patient Education method: Explanation, Demonstration, Tactile cues, Verbal cues, and Handouts Education comprehension: verbalized understanding, returned demonstration, verbal cues required, tactile cues required, and needs further education     PLAN: PT FREQUENCY: 1x/week   PT DURATION: 10 weeks   PLANNED INTERVENTIONS:   Gait training;Stair training;Functional mobility training;DME Instruction;Therapeutic activities;Therapeutic exercise;Balance training;Neuromuscular re-education;Patient/family education;Vestibular;Visual/perceptual remediation/compensation;Passive range of motion;Moist Heat;Cryotherapy;Traction;Canalith Repostioning;Joint Manipulations;Manual lymph drainage;Manual techniques;Scar mobilization;Energy conservation;Dry needling;ADLs/Self Care Home Management;Biofeedback;Electrical Stimulation;Taping    PLAN FOR NEXT SESSION: See clinical impression for plan     GOALS: Goals reviewed with patient? Yes  SHORT TERM GOALS: Target date: 08/22/2024    Pt will demo IND with HEP                    Baseline: Not IND            Goal status: INITIAL   LONG TERM GOALS: Target date: 10/03/2024    1.Pt will demo proper deep core coordination without chest breathing and optimal excursion of diaphragm/pelvic floor in order to promote spinal stability and pelvic floor function  Baseline: dyscoordination Goal status: INITIAL  2.  Pt will demo proper body mechanics in against gravity tasks  and ADLs  work tasks, fitness  to minimize  straining pelvic floor / back    Baseline: not IND, improper form that places strain on pelvic floor  Goal status: INITIAL    3. Pt will demo increased gait speed > 1.3 m/s with reciprocal gait pattern, longer stride length  in order to ambulate safely in community and return to fitness routine  Baseline: 1.14 m/s, excessive sway to R pelvis, limited R posterior rotation of thorax  Goal status: INITIAL    4. Pt will demo levelled pelvic girdle and shoulder height and report  no LBP with L siddeflexion  in order to progress to deep core strengthening HEP and restore mobility at spine, pelvis, gait, posture minimize falls, and improve balance  Baseline: R shoulder,R iliac crest lowered , LBP reproduced with L sideflexion, distance from digit III to floor L sideflexion 50 cm, R 51 cm, limited R trunk rotation compared to L  Goal status: INITIAL   5. Pt will improve PFDI-7 questionnaire to  pts  score change  to demo improved QOL  Baseline:    ( greater pts indicate greater negative impact on QOL)   48  pts  ( total)  29  pts  ( UIQ-7 )     0  pts  ( CRAIQ-7 )  19  pts  ( POPIQ-7 )  Baseline:  Goal status: INITIAL  6. Pt will return to run/ jog for 2 miles with no R LBP or R LBP lasting < 1 day instead of lasting 4-5 days. Pt will experience no LBP with sit to stand after sitting > 30 min  Baseline: limits her from running since having bby ( 2 miles, run walk) and causes R LBP afterwards 5/10 , nonradiating . Pain lasts for 4-5 days. Worst with sitting for > 5 min and then sit to stand.  Goal status: INITIAL  7. Pt will report noticing decreased neck tensions >75% of the time when bottle feeding baby and when stressed with compliance to HEP and relaxation practices  Baseline:   tensions from stress and holding her head down at work, bottle feeding baby  . Tensions do not limit activities but is annoying. Pt is not breastfeeding but when she did breastfeed, neck tensions were worst.  Pt  also gets jaw pain B, migraines also comes with neck pain and stress. Goal status: INITIAL  8. Pt will report compliance and demo proper lengthening of pelvic floor to help with insertion of tampon and  decreased urinary urgency  Baseline:  urgency and pain with tampon insertion Goal status: INITIAL  9. Pt will demo increased C section scar mobility in order to progress to deep core training for improved IAP function for less LBP , pelvic, neck pain  Baseline: restricted C section scar  Goal status: INITIAL  Pia Lupe Plump, PT 07/25/2024, 4:10 PM

## 2024-07-25 NOTE — Patient Instructions (Signed)
 Put down workout routine you want to get back to  Avoid straining pelvic floor, abdominal muscles , spine  Use log rolling technique instead of getting out of bed with your neck or the sit-up  Log rolling into and out of bed Log rolling into and out of bed If getting out of bed on R side, Bent knees, scoot hips/ shoulder to L  Raise R arm completely overhead, rolling onto armpit  Then lower bent knees to bed to get into complete side lying position  Then drop legs off bed, and push up onto R elbow/forearm, and use L hand to push onto the bed __ Proper body mechanics with getting out of a chair to decrease strain  on back &pelvic floor   Avoid holding your breath when Getting out of the chair:  Scoot to front part of chair chair Heels behind knees, feet are hip width apart, nose over toes  Inhale like you are smelling roses Exhale to stand  ___  Sitting with feet on ground, four points of contact Catch yourself crossing ankles and thighs

## 2024-08-01 ENCOUNTER — Ambulatory Visit: Admitting: Physical Therapy

## 2024-08-01 DIAGNOSIS — M5459 Other low back pain: Secondary | ICD-10-CM

## 2024-08-01 DIAGNOSIS — M533 Sacrococcygeal disorders, not elsewhere classified: Secondary | ICD-10-CM

## 2024-08-01 DIAGNOSIS — R2689 Other abnormalities of gait and mobility: Secondary | ICD-10-CM

## 2024-08-01 DIAGNOSIS — M542 Cervicalgia: Secondary | ICD-10-CM

## 2024-08-01 NOTE — Therapy (Signed)
 OUTPATIENT PHYSICAL THERAPY TREATMENT    Patient Name: Valerie Bates MRN: [id number] DOB:1996/04/22, 28 y.o., female Today's Date: 08/01/2024   PT End of Session - 08/01/24 1554     Visit Number 2    Number of Visits 10    Date for Recertification  10/03/24    PT Start Time 1550    PT Stop Time 1630    PT Time Calculation (min) 40 min    Activity Tolerance Patient tolerated treatment well;No increased pain    Behavior During Therapy [facility] for tasks assessed/performed          Past Medical History:  Diagnosis Date   Allergic rhinitis 06/21/2022   Allergy    Anxiety    Depression    Factor 5 Leiden mutation, heterozygous    Migraines    Past Surgical History:  Procedure Laterality Date   CESAREAN SECTION N/A 02/02/2024   Procedure: CESAREAN DELIVERY;  Surgeon: Izell Harari, MD;  Location: [facility] LD ORS;  Service: Obstetrics;  Laterality: N/A;   WISDOM TOOTH EXTRACTION  2015   Patient Active Problem List   Diagnosis Date Noted   Status post primary low transverse cesarean section 02/04/2024   Anxiety 10/01/2019   Depression 05/24/2019   Factor V Leiden mutation 01/05/2017   Inactive tuberculosis 01/05/2017    PCP: Marylynn MD   REFERRING PROVIDER: Vannie Hemming, CNM   REFERRING DIAG: Pelvic Pain   Rationale for Evaluation and Treatment Rehabilitation  THERAPY DIAG:  Sacrococcygeal disorders, not elsewhere classified  Other abnormalities of gait and mobility  Other low back pain  Neck pain  ONSET DATE:   SUBJECTIVE:   SUBJECTIVE STATEMENT TODAY: Pt noticed more LBP this week even without exercising. It hurts while walking, Eases with sitting and lying down. It does not radiate  down her legs. 4-5/10 pain level                                                                                                                                                                                         SUBJECTIVE STATEMENT ON EVAL 07/25/24 : 1) pelvic pain- Pt  had an emergency C section and pt was intubated. Pt is 6 months post partum today. Pt reported she feels the traumatic experience is related to the tensions she feels in the pelvic area. Pt has pelvic pain now with sexual intercourse and with insertion of spectulum and during gynecological exams. Insertion of her IUD procedure also caused pelvic pain . It took trials to get IUD inserted due to pelvic pain with second trial requiring Lidocaine .   2) R LBP  - limits her from running since having  baby ( 2 miles, run walk) and causes R LBP afterwards 5/10 , nonradiating . Pain lasts for 4-5 days. Worst with sitting for > 5 min and then sit to stand. Pt had R LBP since middle / high school. Pt has tried PT and it gets better but pain comes back  . LBP worsened during pregnncy   3) B neck pain - tensions from stress and holding her head down at work, bottle feeding baby  . Tensions do not limit activities but is annoying. Pt is not breastfeeding but when she did breastfeed, neck tensions were worst.  Pt also gets jaw pain B, migraines also comes with neck pain and stress.  4) bladder urgency for the past 5 years prior to pregnancy. Pt has not leakage before getting to bathroom   PERTINENT HISTORY:  Factor 5 Leiden mutation, heterozygous   ( genetic condition puts her at risk for blood clots)   PAIN:  Are you having pain? Yes: see above   PRECAUTIONS: No  WEIGHT BEARING RESTRICTIONS: No   FALLS:  Has patient fallen in last 6 months? No  LIVING ENVIRONMENT: Lives with: husband and newborn  Lives in: one story  Stairs: back 5 STE with rail,front has ramp    OCCUPATION: Advice Worker,   PLOF: IND  PATIENT GOALS:  Be able to function without pain in low back, esp, decrease tension in neck, return to sexual function after pregnancy  Return to cardio, weight training without pain   OBJECTIVE:     [facility] PT Assessment - 08/01/24 1557       Palpation   SI assessment  R shoulder, R  iliac crest lowered  ( Post Tx: Lvelled shoulders and pelvis)    Palpation comment hypomobile T/L junction,  tendenress/ hypomobile T12, L2, L SIJ,  R SIJ ( inferior hip abduction mm).          [facility] Adult PT Treatment/Exercise - 08/01/24 1630       Therapeutic Activites    Therapeutic Activities Other Therapeutic Activities    Other Therapeutic Activities discused holding baby on R sometimes  and other holding positions to minimize overuse of L back mm      Neuro Re-ed    Neuro Re-ed Details  provided propioceptive cues for SIJ and T/L junction mobility stretches      Modalities   Modalities Moist Heat      Moist Heat Therapy   Number Minutes Moist Heat 5 Minutes    Moist Heat Location --   placed thoracic spine , R posterior rotation supported restorative yoga, guided relaxation     Manual Therapy   Manual therapy comments modified for comfort with jostling technique instead PA mobs along thoracic and lumbar and SIJ at problem areas  , ,  rotational mob at SIJ            HOME EXERCISE PROGRAM: See pt instruction section    ASSESSMENT:  CLINICAL IMPRESSION: Following Tx today which pt tolerated without complaints,  pt reported decreased LBP from 5/10 to 1/10. Modified for comfort with jostling technique instead PA mobs along thoracic and lumbar and SIJ at problem areas  Provided propioceptive cues for SIJ and T/L junction mobility stretches  Discused holding baby on R sometimes  and other holding positions to minimize overuse of L back mm  Plan to address realignment of spine/ pelvis at next session to help promote optimize intra-abdominal pressure system (IAP)  for improved pelvic floor function, trunk stability,  gait, balance, stabilization with mobility tasks.  Plan to address pelvic floor issues once pelvis and spine are realigned to yield better outcomes.  Regional interdependent approaches will yield greater benefits in pt's POC.   Pt benefits from skilled PT to  address    1) pelvic pain 2) R LBP 3) B neck pain 4) bladder urgency.    OBJECTIVE IMPAIRMENTS decreased activity tolerance, decreased coordination, decreased endurance, decreased mobility, difficulty walking, decreased ROM, decreased strength, decreased safety awareness, hypomobility, increased muscle spasms, impaired flexibility, improper body mechanics, postural dysfunction, and pain. scar restrictions   ACTIVITY LIMITATIONS  self-care,  sleep, home chores, work tasks    PARTICIPATION LIMITATIONS:  community, gym activities    PERSONAL FACTORS   affecting patient's functional outcome:    REHAB POTENTIAL: Good   CLINICAL DECISION MAKING: Evolving/moderate complexity   EVALUATION COMPLEXITY: Moderate    PATIENT EDUCATION:    Education details: Showed pt anatomy images. Explained muscles attachments/ connection, physiology of deep core system/ spinal- thoracic-pelvis-lower kinetic chain as they relate to pt's presentation, Sx, and past Hx. Explained what and how these areas of deficits need to be restored to balance and function    See Therapeutic activity / neuromuscular re-education section  Answered pt's questions.   Person educated: Patient Education method: Explanation, Demonstration, Tactile cues, Verbal cues, and Handouts Education comprehension: verbalized understanding, returned demonstration, verbal cues required, tactile cues required, and needs further education     PLAN: PT FREQUENCY: 1x/week   PT DURATION: 10 weeks   PLANNED INTERVENTIONS:   Gait training;Stair training;Functional mobility training;DME Instruction;Therapeutic activities;Therapeutic exercise;Balance training;Neuromuscular re-education;Patient/family education;Vestibular;Visual/perceptual remediation/compensation;Passive range of motion;Moist Heat;Cryotherapy;Traction;Canalith Repostioning;Joint Manipulations;Manual lymph drainage;Manual techniques;Scar mobilization;Energy conservation;Dry  needling;ADLs/Self Care Home Management;Biofeedback;Electrical Stimulation;Taping    PLAN FOR NEXT SESSION: See clinical impression for plan     GOALS: Goals reviewed with patient? Yes  SHORT TERM GOALS: Target date: 08/22/2024    Pt will demo IND with HEP                    Baseline: Not IND            Goal status: INITIAL   LONG TERM GOALS: Target date: 10/03/2024    1.Pt will demo proper deep core coordination without chest breathing and optimal excursion of diaphragm/pelvic floor in order to promote spinal stability and pelvic floor function  Baseline: dyscoordination Goal status: INITIAL  2.  Pt will demo proper body mechanics in against gravity tasks and ADLs  work tasks, fitness  to minimize straining pelvic floor / back    Baseline: not IND, improper form that places strain on pelvic floor  Goal status: INITIAL    3. Pt will demo increased gait speed > 1.3 m/s with reciprocal gait pattern, longer stride length  in order to ambulate safely in community and return to fitness routine  Baseline: 1.14 m/s, excessive sway to R pelvis, limited R posterior rotation of thorax  Goal status: INITIAL    4. Pt will demo levelled pelvic girdle and shoulder height and report  no LBP with L siddeflexion  in order to progress to deep core strengthening HEP and restore mobility at spine, pelvis, gait, posture minimize falls, and improve balance  Baseline: R shoulder,R iliac crest lowered , LBP reproduced with L sideflexion, distance from digit III to floor L sideflexion 50 cm, R 51 cm, limited R trunk rotation compared to L  Goal status: INITIAL   5. Pt will improve PFDI-7 questionnaire to  pts  score change  to demo improved QOL  Baseline:    ( greater pts indicate greater negative impact on QOL)   48  pts  ( total)  29  pts  ( UIQ-7 )     0  pts  ( CRAIQ-7 )  19  pts  ( POPIQ-7 )  Baseline:  Goal status: INITIAL  6. Pt will return to run/ jog for 2 miles with no R LBP or  R LBP lasting < 1 day instead of lasting 4-5 days. Pt will experience no LBP with sit to stand after sitting > 30 min  Baseline: limits her from running since having bby ( 2 miles, run walk) and causes R LBP afterwards 5/10 , nonradiating . Pain lasts for 4-5 days. Worst with sitting for > 5 min and then sit to stand.  Goal status: INITIAL  7. Pt will report noticing decreased neck tensions >75% of the time when bottle feeding baby and when stressed with compliance to HEP and relaxation practices  Baseline:   tensions from stress and holding her head down at work, bottle feeding baby  . Tensions do not limit activities but is annoying. Pt is not breastfeeding but when she did breastfeed, neck tensions were worst.  Pt also gets jaw pain B, migraines also comes with neck pain and stress. Goal status: INITIAL  8. Pt will report compliance and demo proper lengthening of pelvic floor to help with insertion of tampon and  decreased urinary urgency  Baseline:  urgency and pain with tampon insertion Goal status: INITIAL  9. Pt will demo increased C section scar mobility in order to progress to deep core training for improved IAP function for less LBP , pelvic, neck pain  Baseline: restricted C section scar  Goal status: INITIAL  Pia Lupe Plump, PT 08/01/2024, 3:55 PM

## 2024-08-01 NOTE — Patient Instructions (Signed)
°  Lengthen Back rib by R  shoulder   ( winging)  Lie on L  side , pillow between knees and under head  Pull  R arm overhead over mattress, grab the edge of mattress,pull it upward, drawing elbow away from ears  Breathing 10 reps Brushing arm with 3/4 turn onto pillow behind back  Lying on L  side ,Pillow/ Block between knees  dragging top forearm across ribs below breast rotating 3/4 turn,  rotating  _R_ only this week ,  relax onto the pillow behind the back  and then back to other palm , maintain top palm on body whole top and not lift shoulder Do this side this week       Wait do both sides until we have levelled out your spine  __   Karolynn pose rocking    Toes tucked, shoulders down and back, on forearms , hands shoulder width apart, fingers straight, elbow back , squeeze imaginary pencils in armpit, shoulder down and away from ears   10 reps    __   3 way childs pose ( center, to the R, to the L)  knees wider ( SIJ and pelvic floor stretch)    Come back to table top position        L hand on L hip, rotate and look up at the ceiling L  ( for this week to realign spine)    Later, you can do both sides  __   kitchen counter stretches  Hands on kitchen counter,   Palms shoulder width apart  , Minisquat postion Trunk is parallel to floor  A) Pull buttocks back to lengthen spine, knees bent  3 breaths   B) Bring R hand to the L, and stretch the R side trunk  3 breaths   Brings hands to center again Do the same to the L side stretch by placing L hand on top of R    C) L hand on L hip, rotate rib and look up at the ceiling L  ( for this week to realign spine)    Later, you can do both sides  __  Hold baby on R side sometimes and alternate

## 2024-08-08 ENCOUNTER — Ambulatory Visit: Admitting: Physical Therapy

## 2024-08-08 DIAGNOSIS — M533 Sacrococcygeal disorders, not elsewhere classified: Secondary | ICD-10-CM

## 2024-08-08 DIAGNOSIS — M5459 Other low back pain: Secondary | ICD-10-CM

## 2024-08-08 DIAGNOSIS — M542 Cervicalgia: Secondary | ICD-10-CM

## 2024-08-08 DIAGNOSIS — R2689 Other abnormalities of gait and mobility: Secondary | ICD-10-CM

## 2024-08-08 NOTE — Patient Instructions (Signed)
 Multifidis twist   Band is on doorknob: sit facing perpendicular to door , sit halfway towards front of chair, firm through 4 points of contact at buttocks and feet. Feet are placed hip with apart.   Twisting trunk without moving the hips and knees Hold band at the level of ribcage, elbows bent,shoulder blades roll back and down like squeezing a pencil under armpit   Exhale twist,.10-15 deg away from door without moving your hips/ knees, press more weight on the side of the sitting bones/ foot opp of your direction of turn as your counterweight. Continue to maintain equal weight through legs.  Keep knee unlocked.  30 reps   __  Walking with arm swings   __  Semi tandem stance with equal weight bearing through legs as alternative  to bilateral stance

## 2024-08-08 NOTE — Therapy (Unsigned)
 OUTPATIENT PHYSICAL THERAPY TREATMENT    Patient Name: Valerie Bates MRN: [id number] DOB:[birth year], 28 y.o., female Today's Date: 08/08/2024   PT End of Session - 08/08/24 1557     Visit Number 3    Number of Visits 10    Date for Recertification  10/03/24    PT Start Time 1550    PT Stop Time 1630    PT Time Calculation (min) 40 min    Activity Tolerance Patient tolerated treatment well;No increased pain    Behavior During Therapy [facility] for tasks assessed/performed          Past Medical History:  Diagnosis Date   Allergic rhinitis 06/21/2022   Allergy    Anxiety    Depression    Factor 5 Leiden mutation, heterozygous    Migraines    Past Surgical History:  Procedure Laterality Date   CESAREAN SECTION N/A 02/02/2024   Procedure: CESAREAN DELIVERY;  Surgeon: Izell Harari, MD;  Location: [facility] LD ORS;  Service: Obstetrics;  Laterality: N/A;   WISDOM TOOTH EXTRACTION  2015   Patient Active Problem List   Diagnosis Date Noted   Status post primary low transverse cesarean section 02/04/2024   Anxiety 10/01/2019   Depression 05/24/2019   Factor V Leiden mutation 01/05/2017   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  Pt has been more mindful of carry her  baby not just on the L side, now alternately at the middle of her body and R side too Pt has not returned to her run/jog nor lifting yet   Pain level now 0/10  [facility] she had pain yesterday 3/10    SUBJECTIVE STATEMENT ON EVAL 07/25/24 : 1) pelvic pain- Pt had an emergency C section and pt was intubated. Pt is 6 months post partum today. Pt reported she feels the traumatic experience is related to the tensions she feels in the pelvic area. Pt has pelvic pain now with sexual intercourse and with insertion of spectulum and during gynecological exams. Insertion of her IUD procedure also caused pelvic pain . It took trials to get IUD inserted due to pelvic pain with second trial requiring Lidocaine .   2) R LBP  - limits her from running since having baby ( 2 miles, run walk) and causes R LBP afterwards 5/10 , nonradiating . Pain lasts for 4-5 days. Worst with sitting for > 5 min and then sit to stand. Pt had R LBP since middle / high school. Pt has tried PT and it gets better but pain comes back  . LBP worsened during pregnncy   3) B neck pain - tensions from stress and holding her head down at work, bottle feeding baby  . Tensions do not limit activities but is annoying. Pt is not breastfeeding but when she did breastfeed, neck tensions were worst.  Pt also gets jaw pain B, migraines also comes with neck pain and stress.  4) bladder urgency for the past 5 years prior to  pregnancy. Pt has not leakage before getting to bathroom   PERTINENT HISTORY:  Factor 5 Leiden mutation, heterozygous   ( genetic condition puts her at risk for blood clots)   PAIN:  Are you having pain? Yes: see above   PRECAUTIONS: No  WEIGHT BEARING RESTRICTIONS: No   FALLS:  Has patient fallen in last 6 months? No  LIVING ENVIRONMENT: Lives with: husband and newborn  Lives in: one story  Stairs: back 5 STE with rail,front has ramp    OCCUPATION: Advice Worker,   PLOF: [employer]  PATIENT GOALS:  Be able to function without pain in low back, esp, decrease tension in neck, return to sexual function after pregnancy  Return to cardio, weight training without pain   OBJECTIVE:      HOME EXERCISE  PROGRAM: See pt instruction section    ASSESSMENT:  CLINICAL IMPRESSION:   Improvements : Pt reported no LBP across the past week since last session.   Plan to address realignment of spine/ pelvis at next session to help promote optimize intra-abdominal pressure system (IAP)  for improved pelvic floor function, trunk stability, gait, balance, stabilization with mobility tasks.  Plan to address pelvic floor issues once pelvis and spine are realigned to yield better outcomes.  Regional interdependent approaches will yield greater benefits in pt's POC.   Pt benefits from skilled PT to address    1) pelvic pain 2) R LBP 3) B neck pain 4) bladder urgency.    OBJECTIVE IMPAIRMENTS decreased activity tolerance, decreased coordination, decreased endurance, decreased mobility, difficulty walking, decreased ROM, decreased strength, decreased safety awareness, hypomobility, increased muscle spasms, impaired flexibility, improper body mechanics, postural dysfunction, and pain. scar restrictions   ACTIVITY LIMITATIONS  self-care,  sleep, home chores, work tasks    PARTICIPATION LIMITATIONS:  community, gym activities    PERSONAL FACTORS   affecting patient's functional outcome:    REHAB POTENTIAL: Good   CLINICAL DECISION MAKING: Evolving/moderate complexity   EVALUATION COMPLEXITY: Moderate    PATIENT EDUCATION:    Education details: Showed pt anatomy images. Explained muscles attachments/ connection, physiology of deep core system/ spinal- thoracic-pelvis-lower kinetic chain as they relate to pt's presentation, Sx, and past Hx. Explained what and how these areas of deficits need to be restored to balance and function    See Therapeutic activity / neuromuscular re-education section  Answered pt's questions.   Person educated: Patient Education method: Explanation, Demonstration, Tactile cues, Verbal cues, and Handouts Education comprehension: verbalized understanding, returned  demonstration, verbal cues required, tactile cues required, and needs further education     PLAN: PT FREQUENCY: 1x/week   PT DURATION: 10 weeks   PLANNED INTERVENTIONS:   Gait training;Stair training;Functional mobility training;DME Instruction;Therapeutic activities;Therapeutic exercise;Balance training;Neuromuscular re-education;Patient/family education;Vestibular;Visual/perceptual remediation/compensation;Passive range of motion;Moist Heat;Cryotherapy;Traction;Canalith Repostioning;Joint Manipulations;Manual lymph drainage;Manual techniques;Scar mobilization;Energy conservation;Dry needling;ADLs/Self Care Home Management;Biofeedback;Electrical Stimulation;Taping    PLAN FOR NEXT SESSION: See clinical impression for plan     GOALS: Goals reviewed with patient? Yes  SHORT TERM GOALS: Target date: 08/22/2024    Pt will demo IND with HEP                    Baseline: Not IND            Goal status: INITIAL   LONG TERM GOALS: Target date: 10/03/2024    1.Pt will demo proper deep core coordination without chest breathing and optimal excursion of diaphragm/pelvic floor in order to promote spinal stability and pelvic floor function  Baseline:  dyscoordination Goal status: INITIAL  2.  Pt will demo proper body mechanics in against gravity tasks and ADLs  work tasks, fitness  to minimize straining pelvic floor / back    Baseline: not IND, improper form that places strain on pelvic floor  Goal status: INITIAL    3. Pt will demo increased gait speed > 1.3 m/s with reciprocal gait pattern, longer stride length  in order to ambulate safely in community and return to fitness routine  Baseline: 1.14 m/s, excessive sway to R pelvis, limited R posterior rotation of thorax  Goal status: INITIAL    4. Pt will demo levelled pelvic girdle and shoulder height and report  no LBP with L siddeflexion  in order to progress to deep core strengthening HEP and restore mobility at spine, pelvis,  gait, posture minimize falls, and improve balance  Baseline: R shoulder,R iliac crest lowered , LBP reproduced with L sideflexion, distance from digit III to floor L sideflexion 50 cm, R 51 cm, limited R trunk rotation compared to L  Goal status: INITIAL   5. Pt will improve PFDI-7 questionnaire to  pts  score change  to demo improved QOL  Baseline:    ( greater pts indicate greater negative impact on QOL)   48  pts  ( total)  29  pts  ( UIQ-7 )     0  pts  ( CRAIQ-7 )  19  pts  ( POPIQ-7 )  Baseline:  Goal status: INITIAL  6. Pt will return to run/ jog for 2 miles with no R LBP or R LBP lasting < 1 day instead of lasting 4-5 days. Pt will experience no LBP with sit to stand after sitting > 30 min  Baseline: limits her from running since having bby ( 2 miles, run walk) and causes R LBP afterwards 5/10 , nonradiating . Pain lasts for 4-5 days. Worst with sitting for > 5 min and then sit to stand.  Goal status: INITIAL  7. Pt will report noticing decreased neck tensions >75% of the time when bottle feeding baby and when stressed with compliance to HEP and relaxation practices  Baseline:   tensions from stress and holding her head down at work, bottle feeding baby  . Tensions do not limit activities but is annoying. Pt is not breastfeeding but when she did breastfeed, neck tensions were worst.  Pt also gets jaw pain B, migraines also comes with neck pain and stress. Goal status: INITIAL  8. Pt will report compliance and demo proper lengthening of pelvic floor to help with insertion of tampon and  decreased urinary urgency  Baseline:  urgency and pain with tampon insertion Goal status: INITIAL  9. Pt will demo increased C section scar mobility in order to progress to deep core training for improved IAP function for less LBP , pelvic, neck pain  Baseline: restricted C section scar  Goal status: INITIAL  Pia Lupe Plump, PT 08/08/2024, 3:57 PM

## 2024-08-29 ENCOUNTER — Telehealth: Payer: Self-pay | Admitting: Physical Therapy

## 2024-08-29 ENCOUNTER — Ambulatory Visit: Admitting: Physical Therapy

## 2024-08-29 NOTE — Telephone Encounter (Signed)
" °  Physical therapist left VM with  pt re: missed appt today. Explained about upcoming appts to focus on deep core and pelvic floor  Informed pt next visit 09/05/24, there will be another pelvic PT shadowing and pt can opt out if she chooses   "

## 2024-09-05 ENCOUNTER — Encounter: Admitting: Physical Therapy

## 2024-09-12 ENCOUNTER — Encounter: Admitting: Physical Therapy

## 2024-09-19 ENCOUNTER — Encounter: Admitting: Physical Therapy

## 2024-09-26 ENCOUNTER — Ambulatory Visit: Attending: Certified Nurse Midwife | Admitting: Physical Therapy

## 2024-09-26 DIAGNOSIS — M542 Cervicalgia: Secondary | ICD-10-CM

## 2024-09-26 DIAGNOSIS — M5459 Other low back pain: Secondary | ICD-10-CM

## 2024-09-26 DIAGNOSIS — M533 Sacrococcygeal disorders, not elsewhere classified: Secondary | ICD-10-CM

## 2024-09-26 DIAGNOSIS — R2689 Other abnormalities of gait and mobility: Secondary | ICD-10-CM

## 2024-09-26 NOTE — Patient Instructions (Addendum)
 Gentle tugs at fascia of C section scar with zig zag stretch   __  Pelvic tilts   __  Deep core level 1-2 ( handout)   _

## 2024-09-27 NOTE — Therapy (Signed)
 " OUTPATIENT PHYSICAL THERAPY TREATMENT    Patient Name: Valerie Bates MRN: [id number] DOB:22-Jul-1996, 29 y.o., female Today's Date: 09/27/2024   PT End of Session - 09/26/24 1526     Visit Number 4    Number of Visits 10    Date for Recertification  10/03/24    PT Start Time 1500    PT Stop Time 1545    PT Time Calculation (min) 45 min    Activity Tolerance Patient tolerated treatment well;No increased pain    Behavior During Therapy [facility] for tasks assessed/performed          Past Medical History:  Diagnosis Date   Allergic rhinitis 06/21/2022   Allergy    Anxiety    Depression    Factor 5 Leiden mutation, heterozygous    Migraines    Past Surgical History:  Procedure Laterality Date   CESAREAN SECTION N/A 02/02/2024   Procedure: CESAREAN DELIVERY;  Surgeon: Izell Harari, MD;  Location: [facility] LD ORS;  Service: Obstetrics;  Laterality: N/A;   WISDOM TOOTH EXTRACTION  2015   Patient Active Problem List   Diagnosis Date Noted   Status post primary low transverse cesarean section 02/04/2024   Anxiety 10/01/2019   Depression 05/24/2019   Factor V Leiden mutation 01/05/2017   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  Pt has been more mindful of carry her  baby not just on the L side, now alternately at the middle of her body and R side too Pt has not returned to her run/jog nor lifting yet   Pain level now 0/10  [facility] she had pain yesterday 3/10    SUBJECTIVE STATEMENT ON EVAL 07/25/24 : 1) pelvic pain- Pt had an emergency C section and pt was intubated. Pt is 6 months post partum today. Pt reported she feels the traumatic experience is related to the tensions she feels in the pelvic area. Pt has pelvic pain now with sexual intercourse and with insertion of spectulum and during gynecological exams. Insertion of her IUD procedure also caused pelvic pain . It took trials to get IUD inserted due to pelvic pain with second trial requiring Lidocaine .   2) R LBP  - limits her from running since having baby ( 2 miles, run walk) and causes R LBP afterwards 5/10 , nonradiating . Pain lasts for 4-5 days. Worst with sitting for > 5 min and then sit to stand. Pt had R LBP since middle / high school. Pt has tried PT and it gets better but pain comes back  . LBP worsened during pregnncy   3) B neck pain - tensions from stress and holding her head down at work, bottle feeding baby  . Tensions do not limit activities but is annoying. Pt is not breastfeeding but when she did breastfeed, neck tensions were worst.  Pt also gets jaw pain B, migraines also comes with neck pain and stress.  4) bladder urgency for the past 5 years prior  to pregnancy. Pt has not leakage before getting to bathroom   PERTINENT HISTORY:  Factor 5 Leiden mutation, heterozygous   ( genetic condition puts her at risk for blood clots)   PAIN:  Are you having pain? Yes: see above   PRECAUTIONS: No  WEIGHT BEARING RESTRICTIONS: No   FALLS:  Has patient fallen in last 6 months? No  LIVING ENVIRONMENT: Lives with: husband and newborn  Lives in: one story  Stairs: back 5 STE with rail,front has ramp    OCCUPATION: Advice Worker,   PLOF: IND  PATIENT GOALS:  Be able to function without pain in low back, esp, decrease tension in neck, return to sexual function after pregnancy  Return to cardio, weight training without pain   OBJECTIVE:    [facility] PT Assessment - 09/26/24  1526       Coordination   Coordination and Movement Description ab overuse with deep core training      Posture/Postural Control   Posture Comments improved posture, less forward head , less rounded shoulders      Palpation   SI assessment  levelled pelvis /    Palpation comment C section scar distal ends with more restirction and tenderness          [facility] Adult PT Treatment/Exercise - 09/26/24 1526       Self-Care   Self-Care Other Self-Care Comments    Other Self-Care Comments  explained deep core and anatomy and sequence, explained how to correct belly breathing instruction given to her in the past and what tactile cues to look for to correct to diaphragm breathing      Neuro Re-ed    Neuro Re-ed Details  provided propioceptive cues for less ab overuse with deep core training, used measuring tape around diaphragm to help with tactile cues for optimal inhalation without upper trap and chest overuse,  guided cues for hands on low ab to achieve sequential change with exhalation      Manual Therapy   Manual therapy comments fascial mobilization with MWM at C section scar, modified technique to accommodate comfort and relaxation           HOME EXERCISE PROGRAM: See pt instruction section    ASSESSMENT:  CLINICAL IMPRESSION:   Improvements : Pt reported no LBP across the past week since last session. But returned yesterdays at a minimal level especially with standing for long periods of time  Improved stacked posture, no more rounded shoulders, forward head posture   Provided fascial mobilization with MWM at C section scar, modified technique to accommodate comfort and relaxation. Progressed to deep core training today but pt required propioceptive cues for less ab overuse with deep core training, used measuring tape around diaphragm to help with tactile cues for optimal inhalation without upper trap and chest overuse,  guided cues for hands on low ab to achieve  sequential change with exhalation. Pt no longer showed dyscoordination, ab straining , chest/upper trap overuse post training.  Withheld level 2 for next session due to pt displaying difficulty with level 1  These improvements will  which will help promote optimize intra-abdominal pressure system (IAP)  for improved pelvic floor function, trunk stability, gait, balance, stabilization with mobility tasks.  Plan to address pelvic floor issues once pelvis and spine are realigned to yield better outcomes.   Plan to progress to  deep core level 2   Discussed the role of alignment of spine and pelvis, connection to feet propioception with workout routine  and standing and bending and lifting and regional interdependence to pelvic pain and dysparuenia. discussed biopsychosocial approaches are added to her POC with body awareness and relaxation to help pt minimize tensions of hte body from traumatic labor and delivery process. . 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  Explained the benefit of multifidis strengthening in HEP to help improve endurance of deep trunk mm to minimize remaining LBP that occurs with long periods of standing and being on her feet at work   Regional interdependent approaches will yield greater benefits in pt's POC.   Pt benefits from skilled PT to address    1) pelvic pain 2) R LBP 3) B neck pain 4) bladder urgency.    OBJECTIVE IMPAIRMENTS decreased activity tolerance, decreased coordination, decreased endurance, decreased mobility, difficulty walking, decreased ROM, decreased strength, decreased safety awareness, hypomobility, increased muscle spasms, impaired flexibility, improper body mechanics, postural dysfunction, and pain. scar restrictions   ACTIVITY LIMITATIONS  self-care,  sleep, home chores, work tasks    PARTICIPATION LIMITATIONS:  community, gym activities    PERSONAL FACTORS   affecting patient's functional outcome:    REHAB POTENTIAL: Good   CLINICAL DECISION  MAKING: Evolving/moderate complexity   EVALUATION COMPLEXITY: Moderate    PATIENT EDUCATION:    Education details: Showed pt anatomy images. Explained muscles attachments/ connection, physiology of deep core system/ spinal- thoracic-pelvis-lower kinetic chain as they relate to pt's presentation, Sx, and past Hx. Explained what and how these areas of deficits need to be restored to balance and function    See Therapeutic activity / neuromuscular re-education section  Answered pt's questions.   Person educated: Patient Education method: Explanation, Demonstration, Tactile cues, Verbal cues, and Handouts Education comprehension: verbalized understanding, returned demonstration, verbal cues required, tactile cues required, and needs further education     PLAN: PT FREQUENCY: 1x/week   PT DURATION: 10 weeks   PLANNED INTERVENTIONS:   Gait training;Stair training;Functional mobility training;DME Instruction;Therapeutic activities;Therapeutic exercise;Balance training;Neuromuscular re-education;Patient/family education;Vestibular;Visual/perceptual remediation/compensation;Passive range of motion;Moist Heat;Cryotherapy;Traction;Canalith Repostioning;Joint Manipulations;Manual lymph drainage;Manual techniques;Scar mobilization;Energy conservation;Dry needling;ADLs/Self Care Home Management;Biofeedback;Electrical Stimulation;Taping    PLAN FOR NEXT SESSION: See clinical impression for plan     GOALS: Goals reviewed with patient? Yes  SHORT TERM GOALS: Target date: 08/22/2024    Pt will demo IND with HEP                    Baseline: Not IND            Goal status: INITIAL   LONG TERM GOALS: Target date: 10/03/2024    1.Pt will demo proper deep core coordination without chest breathing and optimal excursion of diaphragm/pelvic floor in order to promote spinal stability and pelvic floor function  Baseline: dyscoordination Goal status: INITIAL  2.  Pt will demo proper body  mechanics in against gravity tasks and ADLs  work tasks, fitness  to minimize straining pelvic floor / back    Baseline: not IND, improper form that places strain on pelvic floor  Goal status: INITIAL    3. Pt will demo increased gait speed > 1.3 m/s with reciprocal gait pattern, longer stride length  in order to ambulate safely in community and return to fitness routine  Baseline: 1.14 m/s, excessive sway to R pelvis, limited R posterior rotation of thorax  Goal status: INITIAL    4. Pt will demo levelled pelvic girdle and shoulder height and report  no LBP with L siddeflexion  in order to progress to deep core strengthening  HEP and restore mobility at spine, pelvis, gait, posture minimize falls, and improve balance  Baseline: R shoulder,R iliac crest lowered , LBP reproduced with L sideflexion, distance from digit III to floor L sideflexion 50 cm, R 51 cm, limited R trunk rotation compared to L  Goal status: INITIAL   5. Pt will improve PFDI-7 questionnaire to  pts  score change  to demo improved QOL  Baseline:    ( greater pts indicate greater negative impact on QOL)   48  pts  ( total)  29  pts  ( UIQ-7 )     0  pts  ( CRAIQ-7 )  19  pts  ( POPIQ-7 )  Baseline:  Goal status: INITIAL  6. Pt will return to run/ jog for 2 miles with no R LBP or R LBP lasting < 1 day instead of lasting 4-5 days. Pt will experience no LBP with sit to stand after sitting > 30 min  Baseline: limits her from running since having bby ( 2 miles, run walk) and causes R LBP afterwards 5/10 , nonradiating . Pain lasts for 4-5 days. Worst with sitting for > 5 min and then sit to stand.  Goal status: INITIAL  7. Pt will report noticing decreased neck tensions >75% of the time when bottle feeding baby and when stressed with compliance to HEP and relaxation practices  Baseline:   tensions from stress and holding her head down at work, bottle feeding baby  . Tensions do not limit activities but is annoying. Pt is  not breastfeeding but when she did breastfeed, neck tensions were worst.  Pt also gets jaw pain B, migraines also comes with neck pain and stress. Goal status: INITIAL  8. Pt will report compliance and demo proper lengthening of pelvic floor to help with insertion of tampon and  decreased urinary urgency  Baseline:  urgency and pain with tampon insertion Goal status: INITIAL  9. Pt will demo increased C section scar mobility in order to progress to deep core training for improved IAP function for less LBP , pelvic, neck pain  Baseline: restricted C section scar  Goal status: INITIAL  Pia Lupe Plump, PT 09/27/2024, 9:16 AM  "

## 2024-10-03 ENCOUNTER — Ambulatory Visit: Admitting: Physical Therapy

## 2024-10-10 ENCOUNTER — Ambulatory Visit: Admitting: Physical Therapy

## 2024-10-24 ENCOUNTER — Ambulatory Visit: Admitting: Physical Therapy

## 2024-10-30 ENCOUNTER — Ambulatory Visit: Admitting: Physical Therapy

## 2024-11-28 ENCOUNTER — Ambulatory Visit: Admitting: Physical Therapy

## 2024-12-12 ENCOUNTER — Ambulatory Visit: Admitting: Physical Therapy
